# Patient Record
Sex: Female | Born: 1970 | Race: White | Hispanic: No | Marital: Married | Smoking: Current every day smoker
Health system: Southern US, Community
[De-identification: ages and names within clinical notes are randomized; demographics above are authoritative.]

## PROBLEM LIST (undated history)

## (undated) DIAGNOSIS — R51 Headache: Secondary | ICD-10-CM

## (undated) DIAGNOSIS — R519 Headache, unspecified: Secondary | ICD-10-CM

## (undated) DIAGNOSIS — D649 Anemia, unspecified: Secondary | ICD-10-CM

## (undated) DIAGNOSIS — F32A Depression, unspecified: Secondary | ICD-10-CM

## (undated) DIAGNOSIS — Z789 Other specified health status: Secondary | ICD-10-CM

## (undated) DIAGNOSIS — F329 Major depressive disorder, single episode, unspecified: Secondary | ICD-10-CM

## (undated) HISTORY — PX: OOPHORECTOMY: SHX86

## (undated) HISTORY — DX: Other specified health status: Z78.9

## (undated) HISTORY — PX: COLONOSCOPY: SHX5424

## (undated) HISTORY — PX: OTHER SURGICAL HISTORY: SHX169

## (undated) HISTORY — PX: CHOLECYSTECTOMY: SHX55

## (undated) HISTORY — PX: LAPAROSCOPY: SHX197

## (undated) HISTORY — PX: ABDOMINAL HYSTERECTOMY: SHX81

---

## 1998-04-23 ENCOUNTER — Ambulatory Visit (HOSPITAL_COMMUNITY): Admission: RE | Admit: 1998-04-23 | Discharge: 1998-04-24 | Payer: Self-pay

## 1998-12-04 ENCOUNTER — Ambulatory Visit (HOSPITAL_COMMUNITY): Admission: RE | Admit: 1998-12-04 | Discharge: 1998-12-04 | Payer: Self-pay | Admitting: *Deleted

## 1999-01-09 ENCOUNTER — Inpatient Hospital Stay (HOSPITAL_COMMUNITY): Admission: RE | Admit: 1999-01-09 | Discharge: 1999-01-11 | Payer: Self-pay | Admitting: Obstetrics and Gynecology

## 1999-07-23 ENCOUNTER — Emergency Department (HOSPITAL_COMMUNITY): Admission: EM | Admit: 1999-07-23 | Discharge: 1999-07-23 | Payer: Self-pay | Admitting: Emergency Medicine

## 1999-07-23 ENCOUNTER — Encounter: Payer: Self-pay | Admitting: Emergency Medicine

## 1999-09-10 ENCOUNTER — Ambulatory Visit: Admission: RE | Admit: 1999-09-10 | Discharge: 1999-09-10 | Payer: Self-pay | Admitting: Occupational Medicine

## 1999-09-10 ENCOUNTER — Encounter: Payer: Self-pay | Admitting: Occupational Medicine

## 2000-02-02 ENCOUNTER — Emergency Department (HOSPITAL_COMMUNITY): Admission: EM | Admit: 2000-02-02 | Discharge: 2000-02-02 | Payer: Self-pay | Admitting: Emergency Medicine

## 2000-02-02 ENCOUNTER — Encounter: Payer: Self-pay | Admitting: Emergency Medicine

## 2000-08-25 ENCOUNTER — Encounter: Payer: Self-pay | Admitting: Pulmonary Disease

## 2000-08-25 ENCOUNTER — Encounter: Admission: RE | Admit: 2000-08-25 | Discharge: 2000-08-25 | Payer: Self-pay | Admitting: Pulmonary Disease

## 2000-12-20 ENCOUNTER — Ambulatory Visit (HOSPITAL_COMMUNITY): Admission: RE | Admit: 2000-12-20 | Discharge: 2000-12-20 | Payer: Self-pay | Admitting: Occupational Medicine

## 2000-12-20 ENCOUNTER — Encounter: Payer: Self-pay | Admitting: Occupational Medicine

## 2001-03-15 ENCOUNTER — Emergency Department (HOSPITAL_COMMUNITY): Admission: EM | Admit: 2001-03-15 | Discharge: 2001-03-15 | Payer: Self-pay | Admitting: Emergency Medicine

## 2001-08-29 ENCOUNTER — Ambulatory Visit (HOSPITAL_COMMUNITY): Admission: RE | Admit: 2001-08-29 | Discharge: 2001-08-29 | Payer: Self-pay | Admitting: Pulmonary Disease

## 2001-08-29 ENCOUNTER — Encounter: Payer: Self-pay | Admitting: Pulmonary Disease

## 2001-11-30 ENCOUNTER — Emergency Department (HOSPITAL_COMMUNITY): Admission: EM | Admit: 2001-11-30 | Discharge: 2001-11-30 | Payer: Self-pay | Admitting: Internal Medicine

## 2003-08-16 ENCOUNTER — Encounter: Payer: Self-pay | Admitting: *Deleted

## 2003-08-16 ENCOUNTER — Emergency Department (HOSPITAL_COMMUNITY): Admission: EM | Admit: 2003-08-16 | Discharge: 2003-08-16 | Payer: Self-pay | Admitting: *Deleted

## 2006-08-08 ENCOUNTER — Encounter: Admission: RE | Admit: 2006-08-08 | Discharge: 2006-08-08 | Payer: Self-pay | Admitting: Obstetrics and Gynecology

## 2010-04-30 ENCOUNTER — Ambulatory Visit: Payer: Self-pay | Admitting: Gastroenterology

## 2016-12-03 ENCOUNTER — Other Ambulatory Visit: Payer: Self-pay

## 2016-12-03 ENCOUNTER — Encounter: Payer: Self-pay | Admitting: Gastroenterology

## 2016-12-03 ENCOUNTER — Ambulatory Visit (INDEPENDENT_AMBULATORY_CARE_PROVIDER_SITE_OTHER): Payer: Self-pay | Admitting: Gastroenterology

## 2016-12-03 DIAGNOSIS — R1310 Dysphagia, unspecified: Secondary | ICD-10-CM

## 2016-12-03 DIAGNOSIS — R1319 Other dysphagia: Secondary | ICD-10-CM

## 2016-12-03 MED ORDER — PANTOPRAZOLE SODIUM 40 MG PO TBEC: 40.0000 mg | DELAYED_RELEASE_TABLET | Freq: Every day | ORAL | 3 refills | Status: DC

## 2016-12-03 NOTE — Patient Instructions (Signed)
Start taking Protonix once each morning, 30 minutes before breakfast.   We have scheduled you for an upper endoscopy with dilation with Dr. Jena Gaussourk in the near future.

## 2016-12-03 NOTE — Progress Notes (Signed)
  Primary Care Physician:  GOLDING, JOHN CABOT, MD Primary Gastroenterologist:  Dr. Rourk   Chief Complaint  Patient presents with  . Dysphagia    x 2 yrs, getting worse. Hurts in esophagus    HPI:   Nancy Strickland is a 46 y.o. female presenting today at the request of her PCP secondary to dysphagia. She notes chronic symptoms for 2 years but worsening recently over past 2-3 months. Being treated for URI currently. Not better yet and has been on antibiotics for 8 days. Notes globus sensation as well. Rare indigestion but really just depends on what she eats. Tries to avoid foods that trigger it. Not on a reflux medication. If lays flat, feels like air is being cut off. Sometimes odynophagia. Taking Aleve once a day.   Notes significant weight gain in the past year. Has gained about 30 lbs in last 4 months. Feels like she may have gained about 40-50 lbs over past year.   Had an EGD by Dr. Skulskie at [facility] in 2011 noting an irregular Z-line, path noting focal intestinal metaplasia and concern for Barrett's, chronic active gastritis negative H.pylori, normal examined duodenum.  Remote colonoscopy, small hyperplastic polyps per patient report. Requesting records.   Past Medical History:  Diagnosis Date  . Medical history non-contributory     Past Surgical History:  Procedure Laterality Date  . ABDOMINAL HYSTERECTOMY    . CHOLECYSTECTOMY    . ESOPHAGOGASTRODUODENOSCOPY  2011   Dr. Skulskie at [facility]: irregular Z-line, path noting focal intestinal metaplasia and questioning Barrett's, chronic active gastritis negative H.pylori, normal examined duodenum.   . LAPAROSCOPY     several   . OOPHORECTOMY    . right breast lumpectomy     benign    Current Outpatient Prescriptions  Medication Sig Dispense Refill  . amoxicillin-clavulanate (AUGMENTIN) 875-125 MG tablet Take 1 tablet by mouth 2 (two) times daily. Started 11/24/16. For total of 10 days. Taking for Upper respiratory  infection.    . naproxen sodium (ANAPROX) 220 MG tablet Take 220 mg by mouth as needed.    . OVER THE COUNTER MEDICATION Mucinex liquid (pt unsure of exact name of med)    . pantoprazole (PROTONIX) 40 MG tablet Take 1 tablet (40 mg total) by mouth daily. Take 30 minutes prior to breakfast 30 tablet 3   No current facility-administered medications for this visit.     Allergies as of 12/03/2016 - Review Complete 12/03/2016  Allergen Reaction Noted  . Codeine Nausea And Vomiting 12/03/2016  . Hydrocodone Nausea And Vomiting 12/03/2016  . Latex Other (See Comments) 12/03/2016  . Oxycodone Nausea And Vomiting 12/03/2016  . Tramadol  12/03/2016  . Vicodin [hydrocodone-acetaminophen] Nausea And Vomiting 12/03/2016    Family History  Problem Relation Age of Onset  . Colon cancer Neg Hx     Social History   Social History  . Marital status: Single    Spouse name: N/A  . Number of children: N/A  . Years of education: N/A   Occupational History  . Not on file.   Social History Main Topics  . Smoking status: Current Every Day Smoker    Packs/day: 1.00    Years: 23.00  . Smokeless tobacco: Never Used  . Alcohol use No  . Drug use: No  . Sexual activity: Not on file   Other Topics Concern  . Not on file   Social History Narrative  . No narrative on file    Review of Systems:   As mentioned in HPI.   Physical Exam: BP 130/86   Pulse 83   Temp 98 F (36.7 C) (Oral)   Ht 5' 3" (1.6 m)   Wt 297 lb 9.6 oz (135 kg)   BMI 52.72 kg/m  General:   Alert and oriented. Pleasant and cooperative. Well-nourished and well-developed.  Head:  Normocephalic and atraumatic. Eyes:  Without icterus, sclera clear and conjunctiva pink.  Ears:  Normal auditory acuity. Nose:  No deformity, discharge,  or lesions. Mouth:  No deformity or lesions, oral mucosa pink.  Lungs:  Scattered rhonchi  Heart:  S1, S2 present without murmurs appreciated.  Abdomen:  +BS, soft, non-tender and  non-distended. No HSM noted. No guarding or rebound. No masses appreciated.  Rectal:  Deferred  Msk:  Symmetrical without gross deformities. Normal posture. Extremities:  Without  edema. Neurologic:  Alert and  oriented x4;  grossly normal neurologically. Psych:  Alert and cooperative. Normal mood and affect.   

## 2016-12-12 ENCOUNTER — Encounter: Payer: Self-pay | Admitting: Gastroenterology

## 2016-12-12 NOTE — Assessment & Plan Note (Addendum)
46 year old female with chronic dysphagia for the past 2 years but worsening over the past several months, associated with odynophagia, in the setting of GERD. Globus sensation present as well. Last EGD at [facility] in 2011 by Dr. Marva PandaSkulskie as noted in HPI. Currently not on a PPI. Will start Protonix once each morning and proceed with EGD/dilation in near future. Follow soft diet for now.   Proceed with upper endoscopy/dilation in the near future with Dr. Jena Gaussourk. The risks, benefits, and alternatives have been discussed in detail with patient. They have stated understanding and desire to proceed.  PROPOFOL (last procedure in 2011 with Propofol)

## 2016-12-13 NOTE — Progress Notes (Signed)
cc'ed to pcp °

## 2016-12-20 NOTE — Patient Instructions (Signed)
Nancy Strickland  12/20/2016     @PREFPERIOPPHARMACY @   Your procedure is scheduled on  12/27/2016   Report to [facility] at  815  A.M.  Call this number if you have problems the morning of surgery:  [phone]   Remember:  Do not eat food or drink liquids after midnight.  Take these medicines the morning of surgery with A SIP OF WATER  protonix.   Do not wear jewelry, make-up or nail polish.  Do not wear lotions, powders, or perfumes, or deoderant.  Do not shave 48 hours prior to surgery.  Men may shave face and neck.  Do not bring valuables to the hospital.  [facility] is not responsible for any belongings or valuables.  Contacts, dentures or bridgework may not be worn into surgery.  Leave your suitcase in the car.  After surgery it may be brought to your room.  For patients admitted to the hospital, discharge time will be determined by your treatment team.  Patients discharged the day of surgery will not be allowed to drive home.   Name and phone number of your driver:   family Special instructions:  Follow the diet and prep instructions given to you by Dr Luvenia Starch office.  Please read over the following fact sheets that you were given. Anesthesia Post-op Instructions and Care and Recovery After Surgery       Esophagogastroduodenoscopy Introduction Esophagogastroduodenoscopy (EGD) is a procedure to examine the lining of the esophagus, stomach, and first part of the small intestine (duodenum). This procedure is done to check for problems such as inflammation, bleeding, ulcers, or growths. During this procedure, a long, flexible, lighted tube with a camera attached (endoscope) is inserted down the throat. Tell a health care provider about:  Any allergies you have.  All medicines you are taking, including vitamins, herbs, eye drops, creams, and over-the-counter medicines.  Any problems you or family members have had with anesthetic  medicines.  Any blood disorders you have.  Any surgeries you have had.  Any medical conditions you have.  Whether you are pregnant or may be pregnant. What are the risks? Generally, this is a safe procedure. However, problems may occur, including:  Infection.  Bleeding.  A tear (perforation) in the esophagus, stomach, or duodenum.  Trouble breathing.  Excessive sweating.  Spasms of the larynx.  A slowed heartbeat.  Low blood pressure. What happens before the procedure?  Follow instructions from your health care provider about eating or drinking restrictions.  Ask your health care provider about:  Changing or stopping your regular medicines. This is especially important if you are taking diabetes medicines or blood thinners.  Taking medicines such as aspirin and ibuprofen. These medicines can thin your blood. Do not take these medicines before your procedure if your health care provider instructs you not to.  Plan to have someone take you home after the procedure.  If you wear dentures, be ready to remove them before the procedure. What happens during the procedure?  To reduce your risk of infection, your health care team will wash or sanitize their hands.  An IV tube will be put in a vein in your hand or arm. You will get medicines and fluids through this tube.  You will be given one or more of the following:  A medicine to help you relax (sedative).  A medicine to numb the area (local anesthetic). This medicine may be sprayed into  your throat. It will make you feel more comfortable and keep you from gagging or coughing during the procedure.  A medicine for pain.  A mouth guard may be placed in your mouth to protect your teeth and to keep you from biting on the endoscope.  You will be asked to lie on your left side.  The endoscope will be lowered down your throat into your esophagus, stomach, and duodenum.  Air will be put into the endoscope. This will help  your health care provider see better.  The lining of your esophagus, stomach, and duodenum will be examined.  Your health care provider may:  Take a tissue sample so it can be looked at in a lab (biopsy).  Remove growths.  Remove objects (foreign bodies) that are stuck.  Treat any bleeding with medicines or other devices that stop tissue from bleeding.  Widen (dilate) or stretch narrowed areas of your esophagus and stomach.  The endoscope will be taken out. The procedure may vary among health care providers and hospitals. What happens after the procedure?  Your blood pressure, heart rate, breathing rate, and blood oxygen level will be monitored often until the medicines you were given have worn off.  Do not eat or drink anything until the numbing medicine has worn off and your gag reflex has returned. This information is not intended to replace advice given to you by your health care provider. Make sure you discuss any questions you have with your health care provider. Document Released: 03/18/2005 Document Revised: 04/22/2016 Document Reviewed: 10/09/2015  2017 Elsevier Esophagogastroduodenoscopy, Care After Introduction Refer to this sheet in the next few weeks. These instructions provide you with information about caring for yourself after your procedure. Your health care provider may also give you more specific instructions. Your treatment has been planned according to current medical practices, but problems sometimes occur. Call your health care provider if you have any problems or questions after your procedure. What can I expect after the procedure? After the procedure, it is common to have:  A sore throat.  Nausea.  Bloating.  Dizziness.  Fatigue. Follow these instructions at home:  Do not eat or drink anything until the numbing medicine (local anesthetic) has worn off and your gag reflex has returned. You will know that the local anesthetic has worn off when you  can swallow comfortably.  Do not drive for 24 hours if you received a medicine to help you relax (sedative).  If your health care provider took a tissue sample for testing during the procedure, make sure to get your test results. This is your responsibility. Ask your health care provider or the department performing the test when your results will be ready.  Keep all follow-up visits as told by your health care provider. This is important. Contact a health care provider if:  You cannot stop coughing.  You are not urinating.  You are urinating less than usual. Get help right away if:  You have trouble swallowing.  You cannot eat or drink.  You have throat or chest pain that gets worse.  You are dizzy or light-headed.  You faint.  You have nausea or vomiting.  You have chills.  You have a fever.  You have severe abdominal pain.  You have black, tarry, or bloody stools. This information is not intended to replace advice given to you by your health care provider. Make sure you discuss any questions you have with your health care provider. Document Released: 11/01/2012 Document  Revised: 04/22/2016 Document Reviewed: 10/09/2015  2017 Elsevier  Monitored Anesthesia Care Anesthesia is a term that refers to techniques, procedures, and medicines that help a person stay safe and comfortable during a medical procedure. Monitored anesthesia care, or sedation, is one type of anesthesia. Your anesthesia specialist may recommend sedation if you will be having a procedure that does not require you to be unconscious, such as:  Cataract surgery.  A dental procedure.  A biopsy.  A colonoscopy. During the procedure, you may receive a medicine to help you relax (sedative). There are three levels of sedation:  Mild sedation. At this level, you may feel awake and relaxed. You will be able to follow directions.  Moderate sedation. At this level, you will be sleepy. You may not remember  the procedure.  Deep sedation. At this level, you will be asleep. You will not remember the procedure. The more medicine you are given, the deeper your level of sedation will be. Depending on how you respond to the procedure, the anesthesia specialist may change your level of sedation or the type of anesthesia to fit your needs. An anesthesia specialist will monitor you closely during the procedure. Let your health care provider know about:  Any allergies you have.  All medicines you are taking, including vitamins, herbs, eye drops, creams, and over-the-counter medicines.  Any use of steroids (by mouth or as a cream).  Any problems you or family members have had with sedatives and anesthetic medicines.  Any blood disorders you have.  Any surgeries you have had.  Any medical conditions you have, such as sleep apnea.  Whether you are pregnant or may be pregnant.  Any use of cigarettes, alcohol, or street drugs. What are the risks? Generally, this is a safe procedure. However, problems may occur, including:  Getting too much medicine (oversedation).  Nausea.  Allergic reaction to medicines.  Trouble breathing. If this happens, a breathing tube may be used to help with breathing. It will be removed when you are awake and breathing on your own.  Heart trouble.  Lung trouble. Before the procedure Staying hydrated  Follow instructions from your health care provider about hydration, which may include:  Up to 2 hours before the procedure - you may continue to drink clear liquids, such as water, clear fruit juice, black coffee, and plain tea. Eating and drinking restrictions  Follow instructions from your health care provider about eating and drinking, which may include:  8 hours before the procedure - stop eating heavy meals or foods such as meat, fried foods, or fatty foods.  6 hours before the procedure - stop eating light meals or foods, such as toast or cereal.  6 hours  before the procedure - stop drinking milk or drinks that contain milk.  2 hours before the procedure - stop drinking clear liquids. Medicines  Ask your health care provider about:  Changing or stopping your regular medicines. This is especially important if you are taking diabetes medicines or blood thinners.  Taking medicines such as aspirin and ibuprofen. These medicines can thin your blood. Do not take these medicines before your procedure if your health care provider instructs you not to. Tests and exams  You will have a physical exam.  You may have blood tests done to show:  How well your kidneys and liver are working.  How well your blood can clot.  General instructions  Plan to have someone take you home from the hospital or clinic.  If you  will be going home right after the procedure, plan to have someone with you for 24 hours. What happens during the procedure?  Your blood pressure, heart rate, breathing, level of pain and overall condition will be monitored.  An IV tube will be inserted into one of your veins.  Your anesthesia specialist will give you medicines as needed to keep you comfortable during the procedure. This may mean changing the level of sedation.  The procedure will be performed. After the procedure  Your blood pressure, heart rate, breathing rate, and blood oxygen level will be monitored until the medicines you were given have worn off.  Do not drive for 24 hours if you received a sedative.  You may:  Feel sleepy, clumsy, or nauseous.  Feel forgetful about what happened after the procedure.  Have a sore throat if you had a breathing tube during the procedure.  Vomit. This information is not intended to replace advice given to you by your health care provider. Make sure you discuss any questions you have with your health care provider. Document Released: 08/11/2005 Document Revised: 04/23/2016 Document Reviewed: 03/07/2016 Elsevier Interactive  Patient Education  2017 Elsevier Inc. Monitored Anesthesia Care, Care After These instructions provide you with information about caring for yourself after your procedure. Your health care provider may also give you more specific instructions. Your treatment has been planned according to current medical practices, but problems sometimes occur. Call your health care provider if you have any problems or questions after your procedure. What can I expect after the procedure? After your procedure, it is common to:  Feel sleepy for several hours.  Feel clumsy and have poor balance for several hours.  Feel forgetful about what happened after the procedure.  Have poor judgment for several hours.  Feel nauseous or vomit.  Have a sore throat if you had a breathing tube during the procedure. Follow these instructions at home: For at least 24 hours after the procedure:   Do not:  Participate in activities in which you could fall or become injured.  Drive.  Use heavy machinery.  Drink alcohol.  Take sleeping pills or medicines that cause drowsiness.  Make important decisions or sign legal documents.  Take care of children on your own.  Rest. Eating and drinking  Follow the diet that is recommended by your health care provider.  If you vomit, drink water, juice, or soup when you can drink without vomiting.  Make sure you have little or no nausea before eating solid foods. General instructions  Have a responsible adult stay with you until you are awake and alert.  Take over-the-counter and prescription medicines only as told by your health care provider.  If you smoke, do not smoke without supervision.  Keep all follow-up visits as told by your health care provider. This is important. Contact a health care provider if:  You keep feeling nauseous or you keep vomiting.  You feel light-headed.  You develop a rash.  You have a fever. Get help right away if:  You have  trouble breathing. This information is not intended to replace advice given to you by your health care provider. Make sure you discuss any questions you have with your health care provider. Document Released: 03/07/2016 Document Revised: 07/07/2016 Document Reviewed: 03/07/2016 Elsevier Interactive Patient Education  2017 [employer]

## 2016-12-22 ENCOUNTER — Encounter (HOSPITAL_COMMUNITY): Payer: Self-pay | Admitting: *Deleted

## 2016-12-22 ENCOUNTER — Encounter (HOSPITAL_COMMUNITY)
Admission: RE | Admit: 2016-12-22 | Discharge: 2016-12-22 | Disposition: A | Discharge status: Home / Self Care | Payer: Self-pay | Source: Ambulatory Visit | Attending: Internal Medicine | Admitting: Internal Medicine

## 2016-12-22 DIAGNOSIS — R1310 Dysphagia, unspecified: Secondary | ICD-10-CM | POA: Insufficient documentation

## 2016-12-22 DIAGNOSIS — K219 Gastro-esophageal reflux disease without esophagitis: Secondary | ICD-10-CM | POA: Insufficient documentation

## 2016-12-22 DIAGNOSIS — Z01812 Encounter for preprocedural laboratory examination: Secondary | ICD-10-CM | POA: Insufficient documentation

## 2016-12-22 HISTORY — DX: Headache: R51

## 2016-12-22 HISTORY — DX: Depression, unspecified: F32.A

## 2016-12-22 HISTORY — DX: Headache, unspecified: R51.9

## 2016-12-22 HISTORY — DX: Anemia, unspecified: D64.9

## 2016-12-22 HISTORY — DX: Major depressive disorder, single episode, unspecified: F32.9

## 2016-12-22 LAB — CBC WITH DIFFERENTIAL/PLATELET
BASOS ABS: 0 10*3/uL (ref 0.0–0.1)
Basophils Relative: 0 %
Eosinophils Absolute: 0.2 10*3/uL (ref 0.0–0.7)
Eosinophils Relative: 2 %
HEMATOCRIT: 47.3 % — AB (ref 36.0–46.0)
Hemoglobin: 16.3 g/dL — ABNORMAL HIGH (ref 12.0–15.0)
LYMPHS ABS: 3.1 10*3/uL (ref 0.7–4.0)
LYMPHS PCT: 36 %
MCH: 31.9 pg (ref 26.0–34.0)
MCHC: 34.5 g/dL (ref 30.0–36.0)
MCV: 92.6 fL (ref 78.0–100.0)
MONO ABS: 0.5 10*3/uL (ref 0.1–1.0)
MONOS PCT: 6 %
NEUTROS ABS: 4.8 10*3/uL (ref 1.7–7.7)
Neutrophils Relative %: 56 %
Platelets: 219 10*3/uL (ref 150–400)
RBC: 5.11 MIL/uL (ref 3.87–5.11)
RDW: 13.8 % (ref 11.5–15.5)
WBC: 8.6 10*3/uL (ref 4.0–10.5)

## 2016-12-22 LAB — BASIC METABOLIC PANEL
ANION GAP: 7 (ref 5–15)
BUN: 18 mg/dL (ref 6–20)
CALCIUM: 9.3 mg/dL (ref 8.9–10.3)
CO2: 28 mmol/L (ref 22–32)
Chloride: 102 mmol/L (ref 101–111)
Creatinine, Ser: 0.75 mg/dL (ref 0.44–1.00)
GFR calc Af Amer: >60 mL/min (ref >60)
GFR calc non Af Amer: >60 mL/min (ref >60)
GLUCOSE: 108 mg/dL — AB (ref 65–99)
POTASSIUM: 4.3 mmol/L (ref 3.5–5.1)
Sodium: 137 mmol/L (ref 135–145)

## 2016-12-23 ENCOUNTER — Encounter: Payer: Self-pay | Admitting: Internal Medicine

## 2016-12-27 ENCOUNTER — Other Ambulatory Visit: Payer: Self-pay

## 2016-12-27 ENCOUNTER — Encounter (HOSPITAL_COMMUNITY): Payer: Self-pay | Admitting: *Deleted

## 2016-12-27 ENCOUNTER — Encounter (HOSPITAL_COMMUNITY)
Admission: RE | Disposition: A | Discharge status: Home / Self Care | Payer: Self-pay | Source: Ambulatory Visit | Attending: Internal Medicine

## 2016-12-27 ENCOUNTER — Ambulatory Visit (HOSPITAL_COMMUNITY): Payer: Self-pay | Admitting: Anesthesiology

## 2016-12-27 ENCOUNTER — Ambulatory Visit (HOSPITAL_COMMUNITY)
Admission: RE | Admit: 2016-12-27 | Discharge: 2016-12-27 | Disposition: A | Discharge status: Home / Self Care | Payer: Self-pay | Source: Ambulatory Visit | Attending: Internal Medicine | Admitting: Internal Medicine

## 2016-12-27 DIAGNOSIS — Z6841 Body Mass Index (BMI) 40.0 and over, adult: Secondary | ICD-10-CM | POA: Insufficient documentation

## 2016-12-27 DIAGNOSIS — H9209 Otalgia, unspecified ear: Secondary | ICD-10-CM

## 2016-12-27 DIAGNOSIS — K3189 Other diseases of stomach and duodenum: Secondary | ICD-10-CM

## 2016-12-27 DIAGNOSIS — K259 Gastric ulcer, unspecified as acute or chronic, without hemorrhage or perforation: Secondary | ICD-10-CM | POA: Insufficient documentation

## 2016-12-27 DIAGNOSIS — F17210 Nicotine dependence, cigarettes, uncomplicated: Secondary | ICD-10-CM | POA: Insufficient documentation

## 2016-12-27 DIAGNOSIS — Z79899 Other long term (current) drug therapy: Secondary | ICD-10-CM | POA: Insufficient documentation

## 2016-12-27 DIAGNOSIS — K449 Diaphragmatic hernia without obstruction or gangrene: Secondary | ICD-10-CM | POA: Insufficient documentation

## 2016-12-27 DIAGNOSIS — B9681 Helicobacter pylori [H. pylori] as the cause of diseases classified elsewhere: Secondary | ICD-10-CM | POA: Insufficient documentation

## 2016-12-27 DIAGNOSIS — K228 Other specified diseases of esophagus: Secondary | ICD-10-CM | POA: Insufficient documentation

## 2016-12-27 DIAGNOSIS — R1310 Dysphagia, unspecified: Secondary | ICD-10-CM | POA: Insufficient documentation

## 2016-12-27 DIAGNOSIS — K2950 Unspecified chronic gastritis without bleeding: Secondary | ICD-10-CM | POA: Insufficient documentation

## 2016-12-27 DIAGNOSIS — M542 Cervicalgia: Secondary | ICD-10-CM

## 2016-12-27 HISTORY — PX: BIOPSY: SHX5522

## 2016-12-27 HISTORY — PX: ESOPHAGOGASTRODUODENOSCOPY (EGD) WITH PROPOFOL: SHX5813

## 2016-12-27 HISTORY — PX: MALONEY DILATION: SHX5535

## 2016-12-27 SURGERY — ESOPHAGOGASTRODUODENOSCOPY (EGD) WITH PROPOFOL
Anesthesia: Monitor Anesthesia Care

## 2016-12-27 MED ORDER — CHLORHEXIDINE GLUCONATE CLOTH 2 % EX PADS: 6.0000 | MEDICATED_PAD | Freq: Once | CUTANEOUS | Status: DC

## 2016-12-27 MED ORDER — PROPOFOL 10 MG/ML IV BOLUS
INTRAVENOUS | Status: DC | PRN
  Administered 2016-12-27: 10 mg via INTRAVENOUS

## 2016-12-27 MED ORDER — FENTANYL CITRATE (PF) 100 MCG/2ML IJ SOLN
25.0000 ug | INTRAMUSCULAR | Status: AC | PRN
  Administered 2016-12-27 (×2): 25 ug via INTRAVENOUS

## 2016-12-27 MED ORDER — MIDAZOLAM HCL 2 MG/2ML IJ SOLN
0.5000 mg | INTRAMUSCULAR | Status: DC | PRN
  Administered 2016-12-27: 2 mg via INTRAVENOUS
  Filled 2016-12-27: qty 2

## 2016-12-27 MED ORDER — PROPOFOL 500 MG/50ML IV EMUL
INTRAVENOUS | Status: DC | PRN
  Administered 2016-12-27: 125 ug/kg/min via INTRAVENOUS

## 2016-12-27 MED ORDER — LIDOCAINE VISCOUS 2 % MT SOLN
OROMUCOSAL | Status: AC
  Filled 2016-12-27: qty 15

## 2016-12-27 MED ORDER — PROPOFOL 10 MG/ML IV BOLUS
INTRAVENOUS | Status: AC
Start: 2016-12-27 — End: 2016-12-27
  Filled 2016-12-27: qty 40

## 2016-12-27 MED ORDER — ONDANSETRON HCL 4 MG/2ML IJ SOLN
4.0000 mg | Freq: Once | INTRAMUSCULAR | Status: AC
  Administered 2016-12-27: 4 mg via INTRAVENOUS

## 2016-12-27 MED ORDER — LACTATED RINGERS IV SOLN
INTRAVENOUS | Status: DC
  Administered 2016-12-27: 1000 mL via INTRAVENOUS

## 2016-12-27 MED ORDER — FENTANYL CITRATE (PF) 100 MCG/2ML IJ SOLN
INTRAMUSCULAR | Status: AC
  Filled 2016-12-27: qty 2

## 2016-12-27 MED ORDER — LIDOCAINE VISCOUS 2 % MT SOLN
3.0000 mL | OROMUCOSAL | Status: AC | PRN
  Administered 2016-12-27 (×2): 3 mL via OROMUCOSAL

## 2016-12-27 MED ORDER — ONDANSETRON HCL 4 MG/2ML IJ SOLN
INTRAMUSCULAR | Status: AC
  Filled 2016-12-27: qty 2

## 2016-12-27 NOTE — Interval H&P Note (Signed)
History and Physical Interval Note:  12/27/2016 10:04 AM  Nancy Strickland  has presented today for surgery, with the diagnosis of dysphagia  The various methods of treatment have been discussed with the patient and family. After consideration of risks, benefits and other options for treatment, the patient has consented to  Procedure(s) with comments: ESOPHAGOGASTRODUODENOSCOPY (EGD) WITH PROPOFOL (N/A) - 945 MALONEY DILATION (N/A) as a surgical intervention .  The patient's history has been reviewed, patient examined, no change in status, stable for surgery.  I have reviewed the patient's chart and labs.  Questions were answered to the patient's satisfaction.     Nancy Strickland  No change. EGD/ED as feasible appropriate per plan.  The risks, benefits, limitations, alternatives and imponderables have been reviewed with the patient. Potential for esophageal dilation, biopsy, etc. have also been reviewed.  Questions have been answered. All parties agreeable.

## 2016-12-27 NOTE — Anesthesia Postprocedure Evaluation (Signed)
Anesthesia Post Note  Patient: Nancy Strickland  Procedure(s) Performed: Procedure(s) (LRB): ESOPHAGOGASTRODUODENOSCOPY (EGD) WITH PROPOFOL (N/A) MALONEY DILATION (N/A) BIOPSY  Patient location during evaluation: PACU Anesthesia Type: MAC Level of consciousness: awake and alert and oriented Pain management: pain level controlled Respiratory status: spontaneous breathing Cardiovascular status: blood pressure returned to baseline Postop Assessment: no signs of nausea or vomiting Anesthetic complications: no     Last Vitals:  Vitals:   12/27/16 1050 12/27/16 1100  BP: (!) 91/56 (!) 106/54  Pulse: 92 84  Resp: 13 17  Temp:      Last Pain:  Vitals:   12/27/16 1048  TempSrc:   PainSc: 0-No pain                 Yusuke Beza

## 2016-12-27 NOTE — Anesthesia Preprocedure Evaluation (Signed)
Anesthesia Evaluation  Patient identified by MRN, date of birth, ID band Patient awake    Reviewed: Allergy & Precautions, NPO status , Patient's Chart, lab work & pertinent test results  Airway Mallampati: II  TM Distance: >3 FB     Dental  (+) Teeth Intact, Implants   Pulmonary neg pulmonary ROS, Current Smoker,    breath sounds clear to auscultation       Cardiovascular negative cardio ROS   Rhythm:Regular Rate:Normal     Neuro/Psych  Headaches, PSYCHIATRIC DISORDERS Depression    GI/Hepatic Dysphagia    Endo/Other  Morbid obesity  Renal/GU      Musculoskeletal   Abdominal   Peds  Hematology  (+) anemia ,   Anesthesia Other Findings   Reproductive/Obstetrics                             Anesthesia Physical Anesthesia Plan  ASA: II  Anesthesia Plan: MAC   Post-op Pain Management:    Induction: Intravenous  Airway Management Planned: Simple Face Mask  Additional Equipment:   Intra-op Plan:   Post-operative Plan:   Informed Consent: I have reviewed the patients History and Physical, chart, labs and discussed the procedure including the risks, benefits and alternatives for the proposed anesthesia with the patient or authorized representative who has indicated his/her understanding and acceptance.     Plan Discussed with:   Anesthesia Plan Comments:         Anesthesia Quick Evaluation

## 2016-12-27 NOTE — Op Note (Signed)
[facility] Patient Name: Nancy Strickland Procedure Date: 12/27/2016 8:54 AM MRN: [id number] Date of Birth: [birth year] Attending MD: Gennette Pacobert Michael Jaszmine Navejas , MD CSN: [id number] Age: 89 Admit Type: Outpatient Procedure:                [facility]                            gastric biopsy Indications:              Dysphagia Providers:                Gennette Pacobert Michael Carver Murakami, MD, Nena PolioLisa Moore, RN, Lollie Marrowaylor B.                            [place], Pensions consultantTechnician Referring MD:              Medicines:                Propofol per Anesthesia Complications:            No immediate complications. Estimated Blood Loss:     Estimated blood loss was minimal. Procedure:                Pre-Anesthesia Assessment:                           - Prior to the procedure, a History and Physical                            was performed, and patient medications and                            allergies were reviewed. The patient's tolerance of                            previous anesthesia was also reviewed. The risks                            and benefits of the procedure and the sedation                            options and risks were discussed with the patient.                            All questions were answered, and informed consent                            was obtained. Prior Anticoagulants: The patient has                            taken no previous anticoagulant or antiplatelet                            agents. ASA Grade Assessment: II - A patient with  mild systemic disease. After reviewing the risks                            and benefits, the patient was deemed in                            satisfactory condition to undergo the procedure.                           After obtaining informed consent, the endoscope was                            passed under direct vision. Throughout the                            procedure, the patient's blood  pressure, pulse, and                            oxygen saturations were monitored continuously. The                            EG-[id number] ([id number]) scope was introduced through the                            mouth, and advanced to the second part of duodenum.                            The upper GI endoscopy was accomplished without                            difficulty. The patient tolerated the procedure                            well. Scope In: 10:30:38 AM Scope Out: 10:39:36 AM Total Procedure Duration: 0 hours 8 minutes 58 seconds  Findings:      Abnormal distal esophagus consistent with short segment Barrett's. No       nodularity. No esophagitis.      A small hiatal hernia was present. Multiple gastric erosion. No ulcer or       infiltrating process observed. Patent pylorus. duodenal bulbar and D2       erosions; .Dilation was performed with a Maloney dilator with mild       resistance at 54 Fr. The dilation site was examined following endoscope       reinsertion and showed no change. Estimated blood loss: none biopsies of       the anormal gastric mucosa and distal esophagus taken for histologic       study Impression:               - Mucosal changes in the esophagus suspicious for                            Barrett's epithelium. Status post esophageal  dilation followed by biopsy                           - Erosive gastropathy. hiatal hernia. Gastric                            erosions?"status post biopsy.                           - No specimens collected. Moderate Sedation:      Moderate (conscious) sedation was personally administered by an       anesthesia professional. The following parameters were monitored: oxygen       saturation, heart rate, blood pressure, respiratory rate, EKG, adequacy       of pulmonary ventilation, and response to care. Total physician       intraservice time was 12 minutes. Recommendation:           - Patient has a  contact number available for                            emergencies. The signs and symptoms of potential                            delayed complications were discussed with the                            patient. Return to normal activities tomorrow.                            Written discharge instructions were provided to the                            patient.                           - Resume previous diet.                           - Continue present medications.                           - Await pathology results.                           - Procedure Code(s):        --- Professional ---                           [phone], Esophagogastroduodenoscopy, flexible,                            transoral; diagnostic, including collection of                            specimen(s) by brushing or washing, when performed                            (separate  procedure) Diagnosis Code(s):        --- Professional ---                           K22.8, Other specified diseases of esophagus                           K31.89, Other diseases of stomach and duodenum                           R13.10, Dysphagia, unspecified CPT copyright 2016 American Medical Association. All rights reserved. The codes documented in this report are preliminary and upon coder review may  be revised to meet current compliance requirements. Gerrit Friends. Elienai Gailey, MD Gennette Pac, MD 12/27/2016 11:29:04 AM This report has been signed electronically. Number of Addenda: 0

## 2016-12-27 NOTE — Discharge Instructions (Signed)
Gastroesophageal Reflux Disease, Adult Introduction Normally, food travels down the esophagus and stays in the stomach to be digested. If a person has gastroesophageal reflux disease (GERD), food and stomach acid move back up into the esophagus. When this happens, the esophagus becomes sore and swollen (inflamed). Over time, GERD can make small holes (ulcers) in the lining of the esophagus. Follow these instructions at home: Diet  Follow a diet as told by your doctor. You may need to avoid foods and drinks such as:  Coffee and tea (with or without caffeine).  Drinks that contain alcohol.  Energy drinks and sports drinks.  Carbonated drinks or sodas.  Chocolate and cocoa.  Peppermint and mint flavorings.  Garlic and onions.  Horseradish.  Spicy and acidic foods, such as peppers, chili powder, curry powder, vinegar, hot sauces, and BBQ sauce.  Citrus fruit juices and citrus fruits, such as oranges, lemons, and limes.  Tomato-based foods, such as red sauce, chili, salsa, and pizza with red sauce.  Fried and fatty foods, such as donuts, french fries, potato chips, and high-fat dressings.  High-fat meats, such as hot dogs, rib eye steak, sausage, ham, and bacon.  High-fat dairy items, such as whole milk, butter, and cream cheese.  Eat small meals often. Avoid eating large meals.  Avoid drinking large amounts of liquid with your meals.  Avoid eating meals during the 2-3 hours before bedtime.  Avoid lying down right after you eat.  Do not exercise right after you eat. General instructions  Pay attention to any changes in your symptoms.  Take over-the-counter and prescription medicines only as told by your doctor. Do not take aspirin, ibuprofen, or other NSAIDs unless your doctor says it is okay.  Do not use any tobacco products, including cigarettes, chewing tobacco, and e-cigarettes. If you need help quitting, ask your doctor.  Wear loose clothes. Do not wear anything  tight around your waist.  Raise (elevate) the head of your bed about 6 inches (15 cm).  Try to lower your stress. If you need help doing this, ask your doctor.  If you are overweight, lose an amount of weight that is healthy for you. Ask your doctor about a safe weight loss goal.  Keep all follow-up visits as told by your doctor. This is important. Contact a doctor if:  You have new symptoms.  You lose weight and you do not know why it is happening.  You have trouble swallowing, or it hurts to swallow.  You have wheezing or a cough that keeps happening.  Your symptoms do not get better with treatment.  You have a hoarse voice. Get help right away if:  You have pain in your arms, neck, jaw, teeth, or back.  You feel sweaty, dizzy, or light-headed.  You have chest pain or shortness of breath.  You throw up (vomit) and your throw up looks like blood or coffee grounds.  You pass out (faint).  Your poop (stool) is bloody or black.  You cannot swallow, drink, or eat. This information is not intended to replace advice given to you by your health care provider. Make sure you discuss any questions you have with your health care provider. Document Released: 05/03/2008 Document Revised: 04/22/2016 Document Reviewed: 03/12/2015  2017 Elsevier Gastroesophageal Reflux Disease, Adult Normally, food travels down the esophagus and stays in the stomach to be digested. However, when a person has gastroesophageal reflux disease (GERD), food and stomach acid move back up into the esophagus. When this happens, the  esophagus becomes sore and inflamed. Over time, GERD can create small holes (ulcers) in the lining of the esophagus. What are the causes? This condition is caused by a problem with the muscle between the esophagus and the stomach (lower esophageal sphincter, or LES). Normally, the LES muscle closes after food passes through the esophagus to the stomach. When the LES is weakened or  abnormal, it does not close properly, and that allows food and stomach acid to go back up into the esophagus. The LES can be weakened by certain dietary substances, medicines, and medical conditions, including:  Tobacco use.  Pregnancy.  Having a hiatal hernia.  Heavy alcohol use.  Certain foods and beverages, such as coffee, chocolate, onions, and peppermint. What increases the risk? This condition is more likely to develop in:  People who have an increased body weight.  People who have connective tissue disorders.  People who use NSAID medicines. What are the signs or symptoms? Symptoms of this condition include:  Heartburn.  Difficult or painful swallowing.  The feeling of having a lump in the throat.  Abitter taste in the mouth.  Bad breath.  Having a large amount of saliva.  Having an upset or bloated stomach.  Belching.  Chest pain.  Shortness of breath or wheezing.  Ongoing (chronic) cough or a night-time cough.  Wearing away of tooth enamel.  Weight loss. Different conditions can cause chest pain. Make sure to see your health care provider if you experience chest pain. How is this diagnosed? Your health care provider will take a medical history and perform a physical exam. To determine if you have mild or severe GERD, your health care provider may also monitor how you respond to treatment. You may also have other tests, including:  An endoscopy toexamine your stomach and esophagus with a small camera.  A test thatmeasures the acidity level in your esophagus.  A test thatmeasures how much pressure is on your esophagus.  A barium swallow or modified barium swallow to show the shape, size, and functioning of your esophagus. How is this treated? The goal of treatment is to help relieve your symptoms and to prevent complications. Treatment for this condition may vary depending on how severe your symptoms are. Your health care provider may  recommend:  Changes to your diet.  Medicine.  Surgery. Follow these instructions at home: Diet  Follow a diet as recommended by your health care provider. This may involve avoiding foods and drinks such as:  Coffee and tea (with or without caffeine).  Drinks that containalcohol.  Energy drinks and sports drinks.  Carbonated drinks or sodas.  Chocolate and cocoa.  Peppermint and mint flavorings.  Garlic and onions.  Horseradish.  Spicy and acidic foods, including peppers, chili powder, curry powder, vinegar, hot sauces, and barbecue sauce.  Citrus fruit juices and citrus fruits, such as oranges, lemons, and limes.  Tomato-based foods, such as red sauce, chili, salsa, and pizza with red sauce.  Fried and fatty foods, such as donuts, french fries, potato chips, and high-fat dressings.  High-fat meats, such as hot dogs and fatty cuts of red and white meats, such as rib eye steak, sausage, ham, and bacon.  High-fat dairy items, such as whole milk, butter, and cream cheese.  Eat small, frequent meals instead of large meals.  Avoid drinking large amounts of liquid with your meals.  Avoid eating meals during the 2-3 hours before bedtime.  Avoid lying down right after you eat.  Do not exercise  right after you eat. General instructions  Pay attention to any changes in your symptoms.  Take over-the-counter and prescription medicines only as told by your health care provider. Do not take aspirin, ibuprofen, or other NSAIDs unless your health care provider told you to do so.  Do not use any tobacco products, including cigarettes, chewing tobacco, and e-cigarettes. If you need help quitting, ask your health care provider.  Wear loose-fitting clothing. Do not wear anything tight around your waist that causes pressure on your abdomen.  Raise (elevate) the head of your bed 6 inches (15cm).  Try to reduce your stress, such as with yoga or meditation. If you need help  reducing stress, ask your health care provider.  If you are overweight, reduce your weight to an amount that is healthy for you. Ask your health care provider for guidance about a safe weight loss goal.  Keep all follow-up visits as told by your health care provider. This is important. Contact a health care provider if:  You have new symptoms.  You have unexplained weight loss.  You have difficulty swallowing, or it hurts to swallow.  You have wheezing or a persistent cough.  Your symptoms do not improve with treatment.  You have a hoarse voice. Get help right away if:  You have pain in your arms, neck, jaw, teeth, or back.  You feel sweaty, dizzy, or light-headed.  You have chest pain or shortness of breath.  You vomit and your vomit looks like blood or coffee grounds.  You faint.  Your stool is bloody or black.  You cannot swallow, drink, or eat. This information is not intended to replace advice given to you by your health care provider. Make sure you discuss any questions you have with your health care provider. Document Released: 08/25/2005 Document Revised: 04/14/2016 Document Reviewed: 03/12/2015 Elsevier Interactive Patient Education  2017 Elsevier Inc. EGD Discharge instructions Please read the instructions outlined below and refer to this sheet in the next few weeks. These discharge instructions provide you with general information on caring for yourself after you leave the hospital. Your doctor may also give you specific instructions. While your treatment has been planned according to the most current medical practices available, unavoidable complications occasionally occur. If you have any problems or questions after discharge, please call your doctor. ACTIVITY  You may resume your regular activity but move at a slower pace for the next 24 hours.   Take frequent rest periods for the next 24 hours.   Walking will help expel (get rid of) the air and reduce the  bloated feeling in your abdomen.   No driving for 24 hours (because of the anesthesia (medicine) used during the test).   You may shower.   Do not sign any important legal documents or operate any machinery for 24 hours (because of the anesthesia used during the test).  NUTRITION  Drink plenty of fluids.   You may resume your normal diet.   Begin with a light meal and progress to your normal diet.   Avoid alcoholic beverages for 24 hours or as instructed by your caregiver.  MEDICATIONS  You may resume your normal medications unless your caregiver tells you otherwise.  WHAT YOU CAN EXPECT TODAY  You may experience abdominal discomfort such as a feeling of fullness or gas pains.  FOLLOW-UP  Your doctor will discuss the results of your test with you.  SEEK IMMEDIATE MEDICAL ATTENTION IF ANY OF THE FOLLOWING OCCUR:  Excessive nausea (  feeling sick to your stomach) and/or vomiting.   Severe abdominal pain and distention (swelling).   Trouble swallowing.   Temperature over 101 F (37.8 C).   Rectal bleeding or vomiting of blood.   Increase Protonix to 40 mg twice daily  GERD information provided  Strive to lose 15 pounds between now and your next office visit with us  Office visit with us in 3 months  Your symptoms may be related to acid reflux although there could be other causes for your neck symptoms. You may need to see an ENT doctor eventually.

## 2016-12-27 NOTE — Transfer of Care (Signed)
Immediate Anesthesia Transfer of Care Note  Patient: Nancy Strickland  Procedure(s) Performed: Procedure(s) with comments: ESOPHAGOGASTRODUODENOSCOPY (EGD) WITH PROPOFOL (N/A) - [place] (N/A) BIOPSY - gastric and esophageal  Patient Location: PACU  Anesthesia Type:MAC  Level of Consciousness: awake and alert   Airway & Oxygen Therapy: Patient Spontanous Breathing  Post-op Assessment: Report given to RN  Post vital signs: Reviewed and stable  Last Vitals:  Vitals:   12/27/16 1000 12/27/16 1015  BP: 108/61 93/69  Resp: 15 13  Temp:      Last Pain:  Vitals:   12/27/16 0931  TempSrc: Oral  PainSc: 4       Patients Stated Pain Goal: 6 (04/08/01 1117)  Complications: No apparent anesthesia complications

## 2016-12-27 NOTE — H&P (View-Only) (Signed)
Primary Care Physician:  Colette RibasGOLDING, JOHN CABOT, MD Primary Gastroenterologist:  Dr. Jena Gaussourk   Chief Complaint  Patient presents with  . Dysphagia    x 2 yrs, getting worse. Hurts in esophagus    HPI:   Nancy Strickland is a 46 y.o. female presenting today at the request of her PCP secondary to dysphagia. She notes chronic symptoms for 2 years but worsening recently over past 2-3 months. Being treated for URI currently. Not better yet and has been on antibiotics for 8 days. Notes globus sensation as well. Rare indigestion but really just depends on what she eats. Tries to avoid foods that trigger it. Not on a reflux medication. If lays flat, feels like air is being cut off. Sometimes odynophagia. Taking Aleve once a day.   Notes significant weight gain in the past year. Has gained about 30 lbs in last 4 months. Feels like she may have gained about 40-50 lbs over past year.   [facility] noting an irregular Z-line, path noting focal intestinal metaplasia and concern for Barrett's, chronic active gastritis negative H.pylori, normal examined duodenum.  Remote colonoscopy, small hyperplastic polyps per patient report. Requesting records.   Past Medical History:  Diagnosis Date  . Medical history non-contributory     Past Surgical History:  Procedure Laterality Date  . ABDOMINAL HYSTERECTOMY    . CHOLECYSTECTOMY    . ESOPHAGOGASTRODUODENOSCOPY  2011   [facility]: irregular Z-line, path noting focal intestinal metaplasia and questioning Barrett's, chronic active gastritis negative H.pylori, normal examined duodenum.   Marland Kitchen. LAPAROSCOPY     several   . OOPHORECTOMY    . right breast lumpectomy     benign    Current Outpatient Prescriptions  Medication Sig Dispense Refill  . amoxicillin-clavulanate (AUGMENTIN) 875-125 MG tablet Take 1 tablet by mouth 2 (two) times daily. Started 11/24/16. For total of 10 days. Taking for Upper respiratory  infection.    . naproxen sodium (ANAPROX) 220 MG tablet Take 220 mg by mouth as needed.    Marland Kitchen. OVER THE COUNTER MEDICATION Mucinex liquid (pt unsure of exact name of med)    . pantoprazole (PROTONIX) 40 MG tablet Take 1 tablet (40 mg total) by mouth daily. Take 30 minutes prior to breakfast 30 tablet 3   No current facility-administered medications for this visit.     Allergies as of 12/03/2016 - Review Complete 12/03/2016  Allergen Reaction Noted  . Codeine Nausea And Vomiting 12/03/2016  . Hydrocodone Nausea And Vomiting 12/03/2016  . Latex Other (See Comments) 12/03/2016  . Oxycodone Nausea And Vomiting 12/03/2016  . Tramadol  12/03/2016  . Vicodin [hydrocodone-acetaminophen] Nausea And Vomiting 12/03/2016    Family History  Problem Relation Age of Onset  . Colon cancer Neg Hx     Social History   Social History  . Marital status: Single    Spouse name: N/A  . Number of children: N/A  . Years of education: N/A   Occupational History  . Not on file.   Social History Main Topics  . Smoking status: Current Every Day Smoker    Packs/day: 1.00    Years: 23.00  . Smokeless tobacco: Never Used  . Alcohol use No  . Drug use: No  . Sexual activity: Not on file   Other Topics Concern  . Not on file   Social History Narrative  . No narrative on file    Review of Systems:  As mentioned in HPI.   Physical Exam: BP 130/86   Pulse 83   Temp 98 F (36.7 C) (Oral)   Ht 5\' 3"  (1.6 m)   Wt 297 lb 9.6 oz (135 kg)   BMI 52.72 kg/m  General:   Alert and oriented. Pleasant and cooperative. Well-nourished and well-developed.  Head:  Normocephalic and atraumatic. Eyes:  Without icterus, sclera clear and conjunctiva pink.  Ears:  Normal auditory acuity. Nose:  No deformity, discharge,  or lesions. Mouth:  No deformity or lesions, oral mucosa pink.  Lungs:  Scattered rhonchi  Heart:  S1, S2 present without murmurs appreciated.  Abdomen:  +BS, soft, non-tender and  non-distended. No HSM noted. No guarding or rebound. No masses appreciated.  Rectal:  Deferred  Msk:  Symmetrical without gross deformities. Normal posture. Extremities:  Without  edema. Neurologic:  Alert and  oriented x4;  grossly normal neurologically. Psych:  Alert and cooperative. Normal mood and affect.

## 2016-12-28 ENCOUNTER — Encounter (HOSPITAL_COMMUNITY): Payer: Self-pay | Admitting: Internal Medicine

## 2016-12-30 ENCOUNTER — Encounter: Payer: Self-pay | Admitting: Internal Medicine

## 2016-12-31 ENCOUNTER — Telehealth: Payer: Self-pay

## 2016-12-31 NOTE — Telephone Encounter (Signed)
Per RMR- Send letter to patient.  Send copy of letter with path to referring provider and PCP.   Patient needs PrevPak or generic equivalent x 14 days--hold any acid suppression and/or statin therapy patient may be taking during treatment.  

## 2016-12-31 NOTE — Telephone Encounter (Signed)
Letter mailed to the pt. 

## 2017-01-06 ENCOUNTER — Telehealth: Payer: Self-pay | Admitting: Internal Medicine

## 2017-01-06 MED ORDER — AMOXICILL-CLARITHRO-LANSOPRAZ PO MISC: Freq: Two times a day (BID) | ORAL | 0 refills | Status: DC

## 2017-01-06 NOTE — Telephone Encounter (Signed)
Patient did not know that they were going to do a biopsy and she was on antibiotics prior to her endo and thinks there was a mix up   [phone]

## 2017-01-06 NOTE — Telephone Encounter (Signed)
rx has been sent to the pharmacy. 

## 2017-01-06 NOTE — Telephone Encounter (Signed)
Spoke with the pt, she had concerns about taking amox  and a Zpac prior to her procedure. Advised her that the bacteria requires a combination of amox and biaxin and prevacid in order to get rid of the bacteria and that amox by itself wouldn't take care of it. Pt also had concerns that she didn't remember being told that she had biopsies. I reviewed RMR procedure note and assured pt that biopsies were done. Pt is requesting copy of procedure note for her records. I will mail that information to her.

## 2017-01-06 NOTE — Telephone Encounter (Signed)
I spoke with the pt. See other phone note. 

## 2017-01-10 ENCOUNTER — Other Ambulatory Visit: Payer: Self-pay | Admitting: Endocrinology

## 2017-01-10 ENCOUNTER — Ambulatory Visit (INDEPENDENT_AMBULATORY_CARE_PROVIDER_SITE_OTHER): Payer: Self-pay | Admitting: Otolaryngology

## 2017-01-10 DIAGNOSIS — J343 Hypertrophy of nasal turbinates: Secondary | ICD-10-CM

## 2017-01-10 DIAGNOSIS — J310 Chronic rhinitis: Secondary | ICD-10-CM

## 2017-01-10 DIAGNOSIS — J342 Deviated nasal septum: Secondary | ICD-10-CM

## 2017-01-10 DIAGNOSIS — H6122 Impacted cerumen, left ear: Secondary | ICD-10-CM

## 2017-01-10 DIAGNOSIS — H6982 Other specified disorders of Eustachian tube, left ear: Secondary | ICD-10-CM

## 2017-01-10 DIAGNOSIS — E010 Iodine-deficiency related diffuse (endemic) goiter: Secondary | ICD-10-CM

## 2017-01-11 ENCOUNTER — Ambulatory Visit
Admission: RE | Admit: 2017-01-11 | Discharge: 2017-01-11 | Disposition: A | Discharge status: Home / Self Care | Payer: No Typology Code available for payment source | Source: Ambulatory Visit | Attending: Endocrinology | Admitting: Endocrinology

## 2017-01-11 DIAGNOSIS — E010 Iodine-deficiency related diffuse (endemic) goiter: Secondary | ICD-10-CM

## 2017-01-13 NOTE — Telephone Encounter (Signed)
T/C from Tam[facility] at [facility], said pt just called her and is short on the Prevacid that she was taking with Amoxicillin and Biaxin.  She did not read the instructions and she has been taking the Prevacid four times a day.  Tammy wants to know if you want to give another Rx for the Prevacid of if you want pt to take protonix that she already had.  Pt is willing to buy more Prevacid if recommended.   Please advise!

## 2017-01-13 NOTE — Telephone Encounter (Signed)
Give her enough Prevacid 30 mg capsules-taking one twice daily to finish up treatment regimen. Do not miss any doses of the other parts of it

## 2017-01-13 NOTE — Telephone Encounter (Signed)
Gabino [facility]agin [facility] is aware.

## 2017-03-21 ENCOUNTER — Ambulatory Visit: Payer: Self-pay | Admitting: Nurse Practitioner

## 2017-06-14 ENCOUNTER — Other Ambulatory Visit: Payer: Self-pay | Admitting: Endocrinology

## 2017-06-14 DIAGNOSIS — E010 Iodine-deficiency related diffuse (endemic) goiter: Secondary | ICD-10-CM

## 2017-06-20 ENCOUNTER — Other Ambulatory Visit: Payer: Self-pay

## 2017-06-24 ENCOUNTER — Ambulatory Visit
Admission: RE | Admit: 2017-06-24 | Discharge: 2017-06-24 | Disposition: A | Discharge status: Home / Self Care | Payer: No Typology Code available for payment source | Source: Ambulatory Visit | Attending: Endocrinology | Admitting: Endocrinology

## 2017-06-24 DIAGNOSIS — E010 Iodine-deficiency related diffuse (endemic) goiter: Secondary | ICD-10-CM

## 2017-12-30 ENCOUNTER — Encounter: Payer: Self-pay | Admitting: Neurology

## 2017-12-30 ENCOUNTER — Ambulatory Visit: Payer: Self-pay | Admitting: Neurology

## 2017-12-30 VITALS — BP 143/93 | HR 89 | Ht 63.0 in | Wt 306.0 lb

## 2017-12-30 DIAGNOSIS — R413 Other amnesia: Secondary | ICD-10-CM

## 2017-12-30 DIAGNOSIS — G479 Sleep disorder, unspecified: Secondary | ICD-10-CM

## 2017-12-30 MED ORDER — TOPIRAMATE 25 MG PO TABS: ORAL_TABLET | ORAL | 3 refills | Status: DC

## 2017-12-30 NOTE — Patient Instructions (Signed)
   We will check MRI of the brain and get a sleep evaluation.  We will start Topamax for the headache.  Topamax (topiramate) is a seizure medication that has an FDA approval for seizures and for migraine headache. Potential side effects of this medication include weight loss, cognitive slowing, tingling in the fingers and toes, and carbonated drinks will taste bad. If any significant side effects are noted on this drug, please contact our office.

## 2017-12-30 NOTE — Progress Notes (Signed)
Reason for visit: Headache, memory disturbance  Referring physician: Dr. Derenda Mis is a 47 y.o. female  History of present illness:  Nancy Strickland is a 47 year old right-handed white female with a history of morbid obesity.  The patient indicates that 2 years ago she began gaining significant amounts of weight, she gained 50 pounds in 6 months, she has continued to gain weight since that time.  Within the last year and a half she has had onset of headaches that are mainly in the left periorbital area and the headaches are daily in nature.  She has also developed some troubles with memory around the same time.  She indicates a chronic history of insomnia alternating with hypersomnolence.  More recently she has had difficulty with sleeping, she will sleep only about 2 hours and then wake up and cannot get back to sleep.  She indicates that she has to sleep with her head elevated otherwise she obstructs her airway when she lies flat.  She does snore at night.  The patient reports that during the day she has significant drowsiness, particularly in the afternoon and she has difficulty staying awake.  The patient has begun having troubles with memory and concentration.  She has stopped driving a car because she has difficulty with directions with driving.  She has difficulty with focusing.  She has prediabetes, she has had some numbness and tingling in the hands and feet.  She indicates that her balance is slightly off, she does have some dizziness at times.  She denies issues controlling the bladder but she does have alternating constipation and diarrhea.  She denies any family history of headaches.  She did have a maternal grandmother with Alzheimer's disease that began in her 79s.  The patient is sent to this office for further evaluation.  Past Medical History:  Diagnosis Date  . Anemia    During pregnancy  . Depression   . Headache   . Medical history non-contributory     Past  Surgical History:  Procedure Laterality Date  . ABDOMINAL HYSTERECTOMY    . BIOPSY  12/27/2016   Procedure: BIOPSY;  Surgeon: Corbin Ade, MD;  [facility];  Service: Endoscopy;;  gastric and esophageal  . CHOLECYSTECTOMY    . COLONOSCOPY    . ESOPHAGOGASTRODUODENOSCOPY (EGD) WITH PROPOFOL N/A 12/27/2016   Procedure: ESOPHAGOGASTRODUODENOSCOPY (EGD) WITH PROPOFOL;  Surgeon: Corbin Ade, MD;  [facility];  Service: Endoscopy;  Laterality: N/A;  945  . LAPAROSCOPY     several   . MALONEY DILATION N/A 12/27/2016   Procedure: Elease Hashimoto DILATION;  Surgeon: Corbin Ade, MD;  [facility];  Service: Endoscopy;  Laterality: N/A;  . OOPHORECTOMY    . right breast lumpectomy     benign    Family History  Problem Relation Age of Onset  . Colon cancer Neg Hx     Social history:  reports that she has been smoking.  She has a 23.00 pack-year smoking history. she has never used smokeless tobacco. She reports that she does not drink alcohol or use drugs.  Medications:  Prior to Admission medications   Medication Sig Start Date End Date Taking? Authorizing Provider  diphenhydrAMINE (BENADRYL) 25 MG tablet Take 25 mg by mouth daily.   Yes [provider]  naproxen sodium (ANAPROX) 220 MG tablet Take 440 mg by mouth daily as needed (pain).    Yes [provider]  topiramate (  TOPAMAX) 25 MG tablet Take one tablet at night for one week, then take 2 tablets at night for one week, then take 3 tablets at night. 12/30/17   York SpanielWillis, Faatimah Spielberg K, MD      Allergies  Allergen Reactions  . Codeine Nausea And Vomiting    Vomiting   . Hydrocodone Nausea And Vomiting    Violent vomititn  . Latex Other (See Comments)    Pulmonary-shortness of breath. If contact with skin gets a rash  . Oxycodone Nausea And Vomiting    Vomiting   . Tramadol     Upsets stomach. Can tolerate if needed.  . Vicodin [Hydrocodone-Acetaminophen] Nausea And Vomiting     Vomiting     ROS:  Out of a complete 14 system review of symptoms, the patient complains only of the following symptoms, and all other reviewed systems are negative.  Fevers, chills, weight gain Swelling in the legs Difficulty swallowing Skin rash, itching Blurred vision, loss of vision Shortness of breath, cough, snoring Incontinence of the bowels, diarrhea Feeling hot, cold, increased thirst Joint pain, joint swelling, aching muscles Skin sensitivity Memory loss, confusion, headache, difficulty swallowing Depression, decreased energy, change in appetite, disinterest in activities Sleepiness, restless legs  Blood pressure (!) 143/93, pulse 89, height 5\' 3"  (1.6 m), weight (!) 306 lb (138.8 kg).  Physical Exam  General: The patient is alert and cooperative at the time of the examination.  The patient is morbidly obese.  Eyes: Pupils are equal, round, and reactive to light. Discs are flat bilaterally.  No evidence of papilledema is seen, no venous pulsations are noted.  Neck: The neck is supple, no carotid bruits are noted.  Respiratory: The respiratory examination is clear.  Cardiovascular: The cardiovascular examination reveals a regular rate and rhythm, no obvious murmurs or rubs are noted.  Skin: Extremities are without significant edema.  Neurologic Exam  Mental status: The patient is alert and oriented x 3 at the time of the examination. The patient has apparent normal recent and remote memory, with an apparently normal attention span and concentration ability.  The Mini-Mental status examination done today shows a total score of 29/30.  Cranial nerves: Facial symmetry is present. There is good sensation of the face to pinprick and soft touch bilaterally. The strength of the facial muscles and the muscles to head turning and shoulder shrug are normal bilaterally. Speech is well enunciated, no aphasia or dysarthria is noted. Extraocular movements are full. Visual fields  are full. The tongue is midline, and the patient has symmetric elevation of the soft palate. No obvious hearing deficits are noted.  Motor: The motor testing reveals 5 over 5 strength of all 4 extremities. Good symmetric motor tone is noted throughout.  Sensory: Sensory testing is intact to pinprick, soft touch, vibration sensation, and position sense on all 4 extremities. No evidence of extinction is noted.  Coordination: Cerebellar testing reveals good finger-nose-finger and heel-to-shin bilaterally.  Gait and station: Gait is normal. Tandem gait is normal. Romberg is negative. No drift is seen.  Reflexes: Deep tendon reflexes are symmetric, but are depressed bilaterally. Toes are downgoing bilaterally.   Assessment/Plan:  1.  Morbid obesity  2.  Chronic daily headache  3.  Memory disturbance  4.  Excessive daytime drowsiness  The patient has had a significant weight gain issue over the last 2 years, she has developed headaches and memory problems, she is not sleeping well.  The patient likely has a memory disturbance related to the  sleep issue.  She claims that when she lies flat she will obstruct her airway, she has to sleep with her head elevated.  The patient will be sent for a sleep evaluation.  The patient will undergo MRI of the brain, she will have blood work done today.  We will initiate treatment for the headache to include Topamax.  She will need to look out for worsening cognitive dysfunction on this medication.  She will follow-up in 3 months.  Nancy Palau MD 12/30/2017 12:12 PM  [facility] [address] [place], [place] [address]  Phone [phone] Fax [phone]

## 2017-12-31 LAB — HIV ANTIBODY (ROUTINE TESTING W REFLEX): HIV SCREEN 4TH GENERATION: NONREACTIVE

## 2017-12-31 LAB — RPR: RPR Ser Ql: NONREACTIVE

## 2017-12-31 LAB — SEDIMENTATION RATE: SED RATE: 63 mm/h — AB (ref 0–32)

## 2017-12-31 LAB — VITAMIN B12: Vitamin B-12: 520 pg/mL (ref 232–1245)

## 2018-01-02 ENCOUNTER — Telehealth: Payer: Self-pay | Admitting: *Deleted

## 2018-01-02 NOTE — Telephone Encounter (Signed)
-----  Message from Kathrynn Ducking, MD sent at 01/01/2018  2:44 PM EST -----  The blood work results are unremarkable, with exception of an elevated ESR, we will need to recheck. Please call the patient. ----- Message ----- From: Lavone Neri Lab Results In Sent: 12/31/2017   5:41 AM To: Kathrynn Ducking, MD

## 2018-01-02 NOTE — Telephone Encounter (Signed)
Called and LVM for pt (ok per DPR) about lab results per CW,MD note. Advised we will recheck ESR (sedimentation rate) at her next follow up in may to continue to follow this. Gave [facility] phone number if she had further questions/concerns.

## 2018-01-08 ENCOUNTER — Ambulatory Visit
Admission: RE | Admit: 2018-01-08 | Discharge: 2018-01-08 | Disposition: A | Discharge status: Home / Self Care | Payer: No Typology Code available for payment source | Source: Ambulatory Visit | Attending: Neurology | Admitting: Neurology

## 2018-01-08 DIAGNOSIS — R413 Other amnesia: Secondary | ICD-10-CM

## 2018-01-09 ENCOUNTER — Telehealth: Payer: Self-pay | Admitting: Neurology

## 2018-01-09 NOTE — Telephone Encounter (Signed)
  I called the patient.  MRI of the brain is unremarkable.  She will call for any dose adjustments of the Topamax.   MRI brain 01/08/18:  IMPRESSION: Unremarkable MRI scan of the brain without contrast.

## 2018-01-24 ENCOUNTER — Ambulatory Visit: Payer: Self-pay | Admitting: Neurology

## 2018-01-24 ENCOUNTER — Encounter: Payer: Self-pay | Admitting: Neurology

## 2018-01-24 VITALS — BP 138/82 | HR 102 | Ht 63.0 in | Wt 302.0 lb

## 2018-01-24 DIAGNOSIS — Z6841 Body Mass Index (BMI) 40.0 and over, adult: Secondary | ICD-10-CM

## 2018-01-24 DIAGNOSIS — R0683 Snoring: Secondary | ICD-10-CM

## 2018-01-24 DIAGNOSIS — R51 Headache: Secondary | ICD-10-CM

## 2018-01-24 DIAGNOSIS — F17200 Nicotine dependence, unspecified, uncomplicated: Secondary | ICD-10-CM

## 2018-01-24 DIAGNOSIS — R351 Nocturia: Secondary | ICD-10-CM

## 2018-01-24 DIAGNOSIS — R519 Headache, unspecified: Secondary | ICD-10-CM

## 2018-01-24 NOTE — Patient Instructions (Addendum)
Thank you for choosing [facility] for your sleep related care! It was nice to meet you today! I appreciate that you entrust me with your sleep related healthcare concerns. I hope, I was able to address at least some of your concerns today, and that I can help you feel reassured and also get better.    Here is what we discussed today and what we came up with as our plan for you:    Based on your symptoms and your exam I believe you are at risk for obstructive sleep apnea or OSA, and I think we should proceed with a sleep study to determine whether you do or do not have OSA and how severe it is. If you have more than mild OSA, I want you to consider treatment with CPAP. Please remember, the risks and ramifications of moderate to severe obstructive sleep apnea or OSA are: Cardiovascular disease, including congestive heart failure, stroke, difficult to control hypertension, arrhythmias, and even type 2 diabetes has been linked to untreated OSA. Sleep apnea causes disruption of sleep and sleep deprivation in most cases, which, in turn, can cause recurrent headaches, problems with memory, mood, concentration, focus, and vigilance. Most people with untreated sleep apnea report excessive daytime sleepiness, which can affect their ability to drive. Please do not drive if you feel sleepy. Please work on smoking cessation.   I will likely see you back after your sleep study to go over the test results and where to go from there. We will call you after your sleep study to advise about the results (most likely, you will hear from ChoteauKristen, my nurse) and to set up an appointment at the time, as necessary.    Our sleep lab administrative assistant will call you to schedule your sleep study. If you don't hear back from her by about 2 weeks from now, please feel free to call her at [phone]. You can leave a message with your phone number and concerns, if you get the voicemail box. She will call back  as soon as possible.

## 2018-01-24 NOTE — Progress Notes (Signed)
Subjective:    Patient ID: Nancy Strickland is a 47 y.o. female.  HPI     Huston Foley, MD, PhD [facility] [address], [address] [address] [place], [place] [address]  Dear Mellody Dance,   I saw your patient, Nancy Strickland, upon your kind request in my clinic today for initial consultation of her sleep disorder, in particular, concern for underlying obstructive sleep apnea. The patient is unaccompanied today. As you know, Nancy Strickland is a 47 year old right-handed woman with an underlying medical history of headache, depression, anemia, memory disturbance, smoking and morbid obesity with BMI of over 50, who reports snoring and excessive daytime somnolence. I reviewed your office note from 12/30/2017. Her Epworth sleepiness score is 8 out of 24, fatigue score is 55 out of 63. She lives with her husband and her son. She does not work. She smokes about a pack per day and drinks alcohol occasionally, drinks caffeine in the form of coffee, 2-3 cups per day, generally speaking.        She has been gaining weight. Mother had sleep apnea. She has not had a set bedtime or rise time. She is typically out of bed by 9. It may take her hours to fall asleep. She has nocturia about once per average night, she reports frequent morning headaches. She has been gaining weight. She has woken up with a sense of choking. She has been sleeping with her feet up and head of bed elevated for the past 2 years or so. She bought a wedge for her bed.   Her Past Medical History Is Significant For: Past Medical History:  Diagnosis Date  . Anemia    During pregnancy  . Depression   . Headache   . Medical history non-contributory     Her Past Surgical History Is Significant For: Past Surgical History:  Procedure Laterality Date  . ABDOMINAL HYSTERECTOMY    . BIOPSY  12/27/2016   Procedure: BIOPSY;  Surgeon: Corbin Ade, MD;  [facility];  Service: Endoscopy;;  gastric and esophageal   . CHOLECYSTECTOMY    . COLONOSCOPY    . ESOPHAGOGASTRODUODENOSCOPY (EGD) WITH PROPOFOL N/A 12/27/2016   Procedure: ESOPHAGOGASTRODUODENOSCOPY (EGD) WITH PROPOFOL;  Surgeon: Corbin Ade, MD;  [facility];  Service: Endoscopy;  Laterality: N/A;  945  . LAPAROSCOPY     several   . MALONEY DILATION N/A 12/27/2016   Procedure: Elease Hashimoto DILATION;  Surgeon: Corbin Ade, MD;  [facility];  Service: Endoscopy;  Laterality: N/A;  . OOPHORECTOMY    . right breast lumpectomy     benign    Her Family History Is Significant For: Family History  Problem Relation Age of Onset  . Colon cancer Neg Hx     Her Social History Is Significant For: Social History   Socioeconomic History  . Marital status: Married    Spouse name: Tammy Sours  . Number of children: 1  . Years of education: 42  . Highest education level: None  Social Needs  . Financial resource strain: None  . Food insecurity - worry: None  . Food insecurity - inability: None  . Transportation needs - medical: None  . Transportation needs - non-medical: None  Occupational History  . None  Tobacco Use  . Smoking status: Current Every Day Smoker    Packs/day: 1.00    Years: 23.00    Pack years: 23.00  . Smokeless tobacco: Never Used  Substance and  Sexual Activity  . Alcohol use: No  . Drug use: No  . Sexual activity: None  Other Topics Concern  . None  Social History Narrative   Lives with husband   Caffeine use: 2-3 cups per day   Right handed     Her Allergies Are:  Allergies  Allergen Reactions  . Codeine Nausea And Vomiting    Vomiting   . Hydrocodone Nausea And Vomiting    Violent vomititn  . Latex Other (See Comments)    Pulmonary-shortness of breath. If contact with skin gets a rash  . Oxycodone Nausea And Vomiting    Vomiting   . Tramadol     Upsets stomach. Can tolerate if needed.  . Vicodin [Hydrocodone-Acetaminophen] Nausea And Vomiting    Vomiting   :   Her Current  Medications Are:  Outpatient Encounter Medications as of 01/24/2018  Medication Sig  . diphenhydrAMINE (BENADRYL) 25 MG tablet Take 25 mg by mouth daily.  . naproxen sodium (ANAPROX) 220 MG tablet Take 440 mg by mouth daily as needed (pain).   . topiramate (TOPAMAX) 25 MG tablet Take one tablet at night for one week, then take 2 tablets at night for one week, then take 3 tablets at night.   No facility-administered encounter medications on file as of 01/24/2018.   :  Review of Systems:  Out of a complete 14 point review of systems, all are reviewed and negative with the exception of these symptoms as listed below: Review of Systems  Neurological:       Pt presents today to discuss her sleep. Pt has never had a sleep study but does endorse snoring. Pt is complaining of a migraine.  Epworth Sleepiness Scale 0= would never doze 1= slight chance of dozing 2= moderate chance of dozing 3= high chance of dozing  Sitting and reading: 1 Watching TV: 2 Sitting inactive in a public place (ex. Theater or meeting): 0 As a passenger in a car for an hour without a break: 3 Lying down to rest in the afternoon: 2 Sitting and talking to someone: 0 Sitting quietly after lunch (no alcohol): 0 In a car, while stopped in traffic: 0 Total: 8     Objective:  Neurological Exam  Physical Exam Physical Examination:   Vitals:   01/24/18 1348  BP: 138/82  Pulse: (!) 102    General Examination: The patient is a very pleasant 47 y.o. female in no acute distress. She reports having migraine. She wears dark eyeglasses. She requested for me to whisper, rather than talk normally, I did my best to speak softly.    HEENT: Normocephalic, atraumatic, hearing is grossly intact. Face is symmetric with normal facial animation and normal facial sensation. Speech is clear with no dysarthria noted. There is no hypophonia. There is no lip, neck/head, jaw or voice tremor. There are no carotid bruits on auscultation.  Oropharynx exam reveals: mild mouth dryness, adequate dental hygiene and moderate airway crowding, due to smaller airway entry, tonsils about 1+ bilaterally, redundant soft palate. Mallampati is class II. Neck circumference is 17-1/4 inches, she has minimal overbite.  Chest: Clear to auscultation without wheezing, rhonchi or crackles noted.  Heart: S1+S2+0, regular and normal without murmurs, rubs or gallops noted.   Abdomen: Soft, non-tender and non-distended with normal bowel sounds appreciated on auscultation.  Extremities: There is obvious abnormality.   Skin: Warm and dry without trophic changes noted.  Musculoskeletal: exam reveals no obvious joint deformities.   Neurologically:  Mental  status: The patient is awake, alert and oriented in all 4 spheres. Her immediate and remote memory, attention, language skills and fund of knowledge are appropriate. There is no evidence of aphasia, agnosia, apraxia or anomia. Speech is clear with normal prosody and enunciation. Thought process is linear. Mood is normal and affect is constricted.  Cranial nerves II - XII are as described above under HEENT exam. In addition: shoulder shrug is normal with equal shoulder height noted. Motor exam: Normal bulk, strength and tone is noted. There is no resting tremor. Fine motor skills and coordination: grossly intact.  Cerebellar testing: No dysmetria or intention tremor on finger to nose testing. Heel to shin is unremarkable bilaterally. There is no truncal or gait ataxia.  Sensory exam: intact to light touch in the upper and lower extremities.   Assessment and Plan:  In summary, JAMY WHYTE is a very pleasant 47 y.o.-year old female with an underlying medical history of headache, depression, anemia, memory disturbance, smoking and morbid obesity with BMI of over 50, whose history and physical exam are concerning for obstructive sleep apnea (OSA). I had a long chat with the patient  about my findings and  the diagnosis of OSA, its prognosis and treatment options. We talked about medical treatments, surgical interventions and non-pharmacological approaches. I explained in particular the risks and ramifications of untreated moderate to severe OSA, especially with respect to developing cardiovascular disease down the Road, including congestive heart failure, difficult to treat hypertension, cardiac arrhythmias, or stroke. Even type 2 diabetes has, in part, been linked to untreated OSA. Symptoms of untreated OSA include daytime sleepiness, memory problems, mood irritability and mood disorder such as depression and anxiety, lack of energy, as well as recurrent headaches, especially morning headaches. We talked about smoking cessation and trying to maintain a healthy lifestyle in general, as well as the importance of weight control. I advised the patient not to drive when feeling sleepy. I recommended the following at this time: sleep study with potential positive airway pressure titration. (We will score hypopneas at 3%).   I explained the sleep test procedure to the patient and also outlined possible surgical and non-surgical treatment options of OSA, including the use of a custom-made dental device and the use of CPAP. She indicated that she would be willing to try CPAP if the need arises. I answered all her questions today and the patient was in agreement. I will likely see her back after the sleep study is completed and encouraged her to call with any interim questions, concerns, problems or updates.   Thank you very much for allowing me to participate in the care of this nice patient. If I can be of any further assistance to you please do not hesitate to talk to me.  Sincerely,   Huston Foley, MD, PhD

## 2018-01-25 ENCOUNTER — Telehealth: Payer: Self-pay | Admitting: Neurology

## 2018-01-25 MED ORDER — TOPIRAMATE 50 MG PO TABS: 150.0000 mg | ORAL_TABLET | Freq: Every day | ORAL | 3 refills | Status: AC

## 2018-01-25 NOTE — Telephone Encounter (Signed)
Called and spoke with patient. She was referring to topamax 25mg . She started on three tablets at bed last week and has not noticed any benefit. I did advise that she does need to give it a few weeks to notice max benefit but I will send message to Dr. Anne HahnWillis to see if he would like to do anything different. She verbalized understanding.

## 2018-01-25 NOTE — Addendum Note (Signed)
Addended by: York SpanielWILLIS, CHARLES K on: 01/25/2018 05:50 PM   Modules accepted: Orders

## 2018-01-25 NOTE — Telephone Encounter (Signed)
I called the patient, left a message, I will call back later. 

## 2018-01-25 NOTE — Telephone Encounter (Signed)
I called the patient.  The patient is not having any side effects on the Topamax, we will go to 100 mg at night and then begin 150 mg at night.  I will call in the 50 mg tablets.

## 2018-01-25 NOTE — Telephone Encounter (Signed)
While speaking w/ Mrs. Nancy Strickland about her sleep study, she requested that I put in a note for Dr. Anne HahnWillis' nurse to call her. She stated that her medication is not working.

## 2018-02-05 ENCOUNTER — Ambulatory Visit (INDEPENDENT_AMBULATORY_CARE_PROVIDER_SITE_OTHER): Payer: Self-pay | Admitting: Neurology

## 2018-02-05 DIAGNOSIS — R519 Headache, unspecified: Secondary | ICD-10-CM

## 2018-02-05 DIAGNOSIS — R0683 Snoring: Secondary | ICD-10-CM

## 2018-02-05 DIAGNOSIS — G4733 Obstructive sleep apnea (adult) (pediatric): Secondary | ICD-10-CM

## 2018-02-05 DIAGNOSIS — R51 Headache: Secondary | ICD-10-CM

## 2018-02-05 DIAGNOSIS — R351 Nocturia: Secondary | ICD-10-CM

## 2018-02-05 DIAGNOSIS — F17200 Nicotine dependence, unspecified, uncomplicated: Secondary | ICD-10-CM

## 2018-02-05 DIAGNOSIS — G4720 Circadian rhythm sleep disorder, unspecified type: Secondary | ICD-10-CM

## 2018-02-05 DIAGNOSIS — Z6841 Body Mass Index (BMI) 40.0 and over, adult: Secondary | ICD-10-CM

## 2018-02-10 ENCOUNTER — Other Ambulatory Visit: Payer: Self-pay | Admitting: Neurology

## 2018-02-10 DIAGNOSIS — G4733 Obstructive sleep apnea (adult) (pediatric): Secondary | ICD-10-CM

## 2018-02-10 NOTE — Progress Notes (Signed)
Patient referred by Dr. Anne HahnWillis, seen by me on 01/24/18, split night sleep study on 02/05/18. Please call and notify patient that the recent sleep study confirmed the diagnosis of moderate to severe OSA. She did well with CPAP during the study with significant improvement of the respiratory events. Therefore, I would like start the patient on CPAP therapy at home by prescribing a machine for home use. I placed the order in the chart.  Please advise patient that we need a follow up appointment with either myself or one of our nurse practitioners in about 10 weeks post set-up to check for how the patient is feeling and how well the patient is using the machine, etc. Please go ahead and schedule the appointment, while you have the patient on the phone and make sure patient understands the importance of keeping this window for the FU appointment, as it is often an insurance requirement. Failing to adhere to this may result in losing coverage for sleep apnea treatment, at which point most patients are left with a choice of returning the machine or paying out of pocket (and we want neither of this to happen!).  Please re-enforce the importance of compliance with treatment and the need for us to monitor compliance data - again an insurance requirement and usually a good feedback for the patient as far as how they are doing.  Also remind patient, that any PAP machine or mask issues should be first addressed with the DME company, who provided the machine/mask.  Please ask if patient has a preference regarding DME company, may depend on the insurance too.  Please arrange for CPAP set up at home through a DME company of patient's choice.  Once you have spoken to the patient you can close the phone encounter. Please fax/route report to referring provider, thanks,   Huston FoleySaima Bryn Saline, MD, PhD [facility])

## 2018-02-10 NOTE — Procedures (Signed)
PATIENT'S NAME:  Nancy Strickland, Nancy Strickland DOB:      [birth year]      MR#:    [id number]     DATE OF RECORDING: 02/05/2018 REFERRING M.D.: Dr. Stephanie Acre,           PCP: Assunta Found, MD Study Performed:  Split-Night Titration Study HISTORY: 47 year old female with a history of headache, depression, anemia, memory disturbance, smoking and morbid obesity with BMI of over 50, who reports snoring and daytime sleepiness. Her Epworth Sleepiness Score is 8/4 points. BMI of 53.5 kg/m2. The patient's neck circumference measured 17.2 inches.  CURRENT MEDICATIONS: Benadryl, Anaprox, Topamax  PROCEDURE:  This is a multichannel digital polysomnogram utilizing the Somnostar 11.2 system.  Electrodes and sensors were applied and monitored per AASM Specifications.   EEG, EOG, Chin and Limb EMG, were sampled at 200 Hz.  ECG, Snore and Nasal Pressure, Thermal Airflow, Respiratory Effort, CPAP Flow and Pressure, Oximetry was sampled at 50 Hz. Digital video and audio were recorded.      BASELINE STUDY WITHOUT CPAP RESULTS:  Lights Out was at 21:11 and Lights On at 04:57 for the night, split study started at 01:24, epoch 512. Total recording time (TRT) was 251, with a total sleep time (TST) of 170 minutes. The patient's sleep latency was 65.5 minutes, which is delayed. REM latency was 127 minutes.  The sleep efficiency was 67.7 %.    SLEEP ARCHITECTURE: WASO (Wake after sleep onset) was 16 minutes, Stage N1 was 4 minutes, Stage N2 was 110 minutes, Stage N3 was 4.5 minutes and Stage R (REM sleep) was 51.5 minutes.  The percentages were Stage N1 2.4%, Stage N2 64.7%, Stage N3 2.6% and Stage R (REM sleep) 30.3%. The arousals were noted as: 21 were spontaneous, 0 were associated with PLMs, 16 were associated with respiratory events. Audio and video analysis did not show any abnormal or unusual movements, behaviors, phonations or vocalizations. The patient took no bathroom breaks. Snoring was noted. The EKG was in keeping with  normal sinus rhythm (NSR).  RESPIRATORY ANALYSIS:  There were a total of 43 respiratory events:  1 obstructive apneas, 0 central apneas and 0 mixed apneas with a total of 1 apneas and an apnea index (AI) of .4. There were 42 hypopneas with a hypopnea index of 14.8. The patient also had 0 respiratory event related arousals (RERAs).  Snoring was noted.     The total APNEA/HYPOPNEA INDEX (AHI) was 15.2 /hour and the total RESPIRATORY DISTURBANCE INDEX was 15.2 /hour.  43 events occurred in REM sleep and 0 events in NREM. The REM AHI was 50.1, /hour versus a non-REM AHI of 0 /hour. The patient spent 289.5 minutes sleep time in the supine position 0 minutes in non-supine. The supine AHI was 15.2 /hour versus a non-supine AHI of 0.0 /hour.  OXYGEN SATURATION & C02:  The wake baseline 02 saturation was 94%, with the lowest being 80%. Time spent below 89% saturation equaled 27 minutes.  PERIODIC LIMB MOVEMENTS: The patient had a total of 0 Periodic Limb Movements.  The Periodic Limb Movement (PLM) index was 0 /hour and the PLM Arousal index was 0 /hour.  TITRATION STUDY WITH CPAP RESULTS:   The patient was fitted with a small Simplus FFM. CPAP was initiated at 5 cmH20 with heated humidity per AASM split night standards and pressure was advanced to 11 cmH20 because of hypopneas, apneas and desaturations. At a PAP pressure of 10 cmH20, there was a reduction of the AHI to  0/hour with supine NREM sleep achieved, O2 nadir of 91%.   Total recording time (TRT) was 215.5 minutes, with a total sleep time (TST) of 119.5 minutes. The patient's sleep latency was 92 minutes, which is markedly delayed. REM latency was 50.5 minutes.  The sleep efficiency was 55.5 %.    SLEEP ARCHITECTURE: Wake after sleep was 4 minutes, Stage N1 1 minutes, Stage N2 107.5 minutes, Stage N3 0 minutes and Stage R (REM sleep) 11 minutes. The percentages were: Stage N1 .8%, Stage N2 90.%, Stage N3 was absent and Stage R (REM sleep) was 9.2%.  The arousals were noted as: 3 were spontaneous, 0 were associated with PLMs, 0 were associated with respiratory events.  RESPIRATORY ANALYSIS:  There were a total of 0 respiratory events: 0 obstructive apneas, 0 central apneas and 0 mixed apneas with a total of 0 apneas and an apnea index (AI) of 0. There were 0 hypopneas with a hypopnea index of 0 /hour. The patient also had 0 respiratory event related arousals (RERAs).      The total APNEA/HYPOPNEA INDEX  (AHI) was 0 /hour and the total RESPIRATORY DISTURBANCE INDEX was 0 /hour.  0 events occurred in REM sleep and 0 events in NREM. The REM AHI was 0 /hour versus a non-REM AHI of 0 /hour. REM sleep was achieved on a pressure of  cm/h2o (AHI was  .) The patient spent 100% of total sleep time in the supine position. The supine AHI was 0.0 /hour, versus a non-supine AHI of 0.0/hour.  OXYGEN SATURATION & C02:  The wake baseline 02 saturation was 94%, with the lowest being 89%. Time spent below 89% saturation equaled 0 minutes.  PERIODIC LIMB MOVEMENTS: The patient had a total of 0 Periodic Limb Movements. The Periodic Limb Movement (PLM) index was 0 /hour and the PLM Arousal index was 0 /hour.  Post-study, the patient indicated that sleep was the same as usual.  POLYSOMNOGRAPHY IMPRESSION :   1. Obstructive Sleep Apnea (OSA)  2. Dysfunctions associated with sleep stages or arousals from sleep  RECOMMENDATIONS:  1. This patient has moderate to severe obstructive sleep apnea and responded well on CPAP therapy. I will, therefore, start the patient on home CPAP treatment at a pressure of 10 cm via small FFM, with heated humidity. The patient should be reminded to be fully compliant with PAP therapy to improve sleep related symptoms and decrease long term cardiovascular risks. Please note that untreated obstructive sleep apnea carries additional perioperative morbidity. Patients with significant obstructive sleep apnea should receive perioperative PAP  therapy and the surgeons and particularly the anesthesiologist should be informed of the diagnosis and the severity of the sleep disordered breathing. 2. This study shows sleep fragmentation and abnormal sleep stage percentages; these are nonspecific findings and per se do not signify an intrinsic sleep disorder or a cause for the patient's sleep-related symptoms. Causes include (but are not limited to) the first night effect of the sleep study, circadian rhythm disturbances, medication effect or an underlying mood disorder or medical problem.  3. The patient should be cautioned not to drive, work at heights, or operate dangerous or heavy equipment when tired or sleepy. Review and reiteration of good sleep hygiene measures should be pursued with any patient. 4. The patient will be seen in follow-up by Dr. Frances Furbish at [facility] for discussion of the test results and further management strategies. The referring provider will be notified of the test results.  I certify that I have reviewed  the entire raw data recording prior to the issuance of this report in accordance with the Standards of Accreditation of the American Academy of Sleep Medicine (AASM)   Huston FoleySaima Sakib Noguez, MD, PhD Diplomat, American Board of Psychiatry and Neurology (Neurology and Sleep Medicine)

## 2018-02-13 ENCOUNTER — Telehealth: Payer: Self-pay

## 2018-02-13 NOTE — Telephone Encounter (Signed)
-----   Message from Huston FoleySaima Athar, MD sent at 02/10/2018 12:51 PM EDT ----- Patient referred by Dr. Anne HahnWillis, seen by me on 01/24/18, split night sleep study on 02/05/18. Please call and notify patient that the recent sleep study confirmed the diagnosis of moderate to severe OSA. She did well with CPAP during the study with significant improvement of the respiratory events. Therefore, I would like start the patient on CPAP therapy at home by prescribing a machine for home use. I placed the order in the chart.  Please advise patient that we need a follow up appointment with either myself or one of our nurse practitioners in about 10 weeks post set-up to check for how the patient is feeling and how well the patient is using the machine, etc. Please go ahead and schedule the appointment, while you have the patient on the phone and make sure patient understands the importance of keeping this window for the FU appointment, as it is often an insurance requirement. Failing to adhere to this may result in losing coverage for sleep apnea treatment, at which point most patients are left with a choice of returning the machine or paying out of pocket (and we want neither of this to happen!).  Please re-enforce the importance of compliance with treatment and the need for us to monitor compliance data - again an insurance requirement and usually a good feedback for the patient as far as how they are doing.  Also remind patient, that any PAP machine or mask issues should be first addressed with the DME company, who provided the machine/mask.  Please ask if patient has a preference regarding DME company, may depend on the insurance too.  Please arrange for CPAP set up at home through a DME company of patient's choice.  Once you have spoken to the patient you can close the phone encounter. Please fax/route report to referring provider, thanks,   Huston FoleySaima Athar, MD, PhD [facility])

## 2018-02-13 NOTE — Telephone Encounter (Signed)
I called pt and explained her sleep study results. Pt has a cpap at home that is in storage, with equipment. Since she does not have current health insurance, she is asking to avoid using a DME. She is asking if our sleep lab manager can look at the machine and set it to the correct pressure. Pt says that she has plenty of supplies. Pt was agreeable to an appt on 05/22/18 at 9:30am with Dr. Frances FurbishAthar for follow up. I will send to Zella BallRobin for further discussion with the pt.

## 2018-02-14 ENCOUNTER — Telehealth: Payer: Self-pay

## 2018-02-14 NOTE — Telephone Encounter (Signed)
Spoke with patient to help with her cpap machine. She currently does not have insurance and is going to use her moms unit that is not in use. I went over the equipment over phone and how to set pressure. She is going to clean it up and try top set it but if she has trouble she will bring machine in tomorrow and I will help her. I explained that she needed an SDcard and she was going to look for that.

## 2018-04-20 ENCOUNTER — Ambulatory Visit: Payer: Self-pay | Admitting: Adult Health

## 2018-04-20 NOTE — Progress Notes (Addendum)
[facility]  PATIENT: Nancy Strickland DOB: May 24, 1971   REASON FOR VISIT: Follow-up for obstructive sleep apnea here for CPAP compliance headaches HISTORY FROM:headache    HISTORY OF PRESENT ILLNESS:UPDATE 5/24/2019CM Nancy Strickland, 47 year old female returns for follow-up with newly diagnosed obstructive sleep apnea.  She does not have insurance and she is using a CPAP machine that her mother had. Zella Ball told her how to program it over the phone. The chip cannot be read today by the computer system. Patient does say she feels better on CPAP,  headaches are improved .She is having side effects to Topamax and wants to taper off the medication.  She continues to smoke.  Her ESS is 8 out of 24.  She returns for reevaluation  Nancy Strickland, upon your kind request in my clinic today for initial consultation of her sleep disorder, in particular, concern for underlying obstructive sleep apnea. The patient is unaccompanied today. As you know, Nancy Strickland is a 47 year old right-handed woman with an underlying medical history of headache, depression, anemia, memory disturbance, smoking and morbid obesity with BMI of over 50, who reports snoring and excessive daytime somnolence. I reviewed your office note from 12/30/2017. Her Epworth sleepiness score is 8 out of 24, fatigue score is 55 out of 63. She lives with her husband and her son. She does not work. She smokes about a pack per day and drinks alcohol occasionally, drinks caffeine in the form of coffee, 2-3 cups per day, generally speaking.        She has been gaining weight. Mother had sleep apnea. She has not had a set bedtime or rise time. She is typically out of bed by 9. It may take her hours to fall asleep. She has nocturia about once per average night, she reports frequent morning headaches. She has been gaining weight. She has woken up with a sense of choking. She has been sleeping with her feet up and head of bed elevated for  the past 2 years or so. She bought a wedge for her bed.    REVIEW OF SYSTEMS: Full 14 system review of systems performed and notable only for those listed, all others are neg:  Constitutional: neg  Cardiovascular: neg Ear/Nose/Throat: neg  Skin: neg Eyes: neg Respiratory: neg Gastroitestinal: neg  Hematology/Lymphatic: neg  Endocrine: neg Musculoskeletal:neg Allergy/Immunology: neg Neurological: neg Psychiatric: neg Sleep : Obstructive sleep apnea with CPAP   ALLERGIES: Allergies  Allergen Reactions  . Codeine Nausea And Vomiting    Vomiting   . Hydrocodone Nausea And Vomiting    Violent vomititn  . Latex Other (See Comments)    Pulmonary-shortness of breath. If contact with skin gets a rash  . Oxycodone Nausea And Vomiting    Vomiting   . Tramadol     Upsets stomach. Can tolerate if needed.  . Vicodin [Hydrocodone-Acetaminophen] Nausea And Vomiting    Vomiting     HOME MEDICATIONS: Outpatient Medications Prior to Visit  Medication Sig Dispense Refill  . diphenhydrAMINE (BENADRYL) 25 MG tablet Take 25 mg by mouth daily.    . naproxen sodium (ANAPROX) 220 MG tablet Take 440 mg by mouth daily as needed (pain).     . topiramate (TOPAMAX) 50 MG tablet Take 3 tablets (150 mg total) by mouth at bedtime. (Patient taking differently: Take 100 mg by mouth at bedtime. ) 90 tablet 3   No facility-administered medications prior to visit.     PAST MEDICAL HISTORY: Past Medical History:  Diagnosis Date  .  Anemia    During pregnancy  . Depression   . Headache   . Medical history non-contributory     PAST SURGICAL HISTORY: Past Surgical History:  Procedure Laterality Date  . ABDOMINAL HYSTERECTOMY    . BIOPSY  12/27/2016   Procedure: BIOPSY;  Surgeon: Corbin Ade, MD;  [facility]: [facility];  Service: Endoscopy;;  gastric and esophageal  . CHOLECYSTECTOMY    . COLONOSCOPY    . ESOPHAGOGASTRODUODENOSCOPY (EGD) WITH PROPOFOL N/A 12/27/2016   Procedure:  ESOPHAGOGASTRODUODENOSCOPY (EGD) WITH PROPOFOL;  Surgeon: Corbin Ade, MD;  Location: [facility];  Service: Endoscopy;  Laterality: N/A;  945  . LAPAROSCOPY     several   . MALONEY DILATION N/A 12/27/2016   Procedure: Elease Hashimoto DILATION;  Surgeon: Corbin Ade, MD;  Location: [facility];  Service: Endoscopy;  Laterality: N/A;  . OOPHORECTOMY    . right breast lumpectomy     benign    FAMILY HISTORY: Family History  Problem Relation Age of Onset  . Colon cancer Neg Hx     SOCIAL HISTORY: Social History   Socioeconomic History  . Marital status: Married    Spouse name: Tammy Sours  . Number of children: 1  . Years of education: 2  . Highest education level: Not on file  Occupational History  . Not on file  Social Needs  . Financial resource strain: Not on file  . Food insecurity:    Worry: Not on file    Inability: Not on file  . Transportation needs:    Medical: Not on file    Non-medical: Not on file  Tobacco Use  . Smoking status: Current Every Day Smoker    Packs/day: 1.00    Years: 23.00    Pack years: 23.00  . Smokeless tobacco: Never Used  Substance and Sexual Activity  . Alcohol use: No  . Drug use: No  . Sexual activity: Not on file  Lifestyle  . Physical activity:    Days per week: Not on file    Minutes per session: Not on file  . Stress: Not on file  Relationships  . Social connections:    Talks on phone: Not on file    Gets together: Not on file    Attends religious service: Not on file    Active member of club or organization: Not on file    Attends meetings of clubs or organizations: Not on file    Relationship status: Not on file  . Intimate partner violence:    Fear of current or ex partner: Not on file    Emotionally abused: Not on file    Physically abused: Not on file    Forced sexual activity: Not on file  Other Topics Concern  . Not on file  Social History Narrative   Lives with husband   Caffeine use: 2-3 cups per day   Right  handed      PHYSICAL EXAM  Vitals:   04/21/18 0827  BP: 121/80  Pulse: 86  SpO2: 96%  Weight: (!) 300 lb 12.8 oz (136.4 kg)  Height:  (1.6 m)   Body mass index is 53.28 kg/m.  Generalized: Well developed, morbidly obese female in no acute distress  Head: normocephalic and atraumatic,. Oropharynx benign  Neck: Supple,  Musculoskeletal: No deformity   Neurological examination   Mentation: Alert oriented to time, place, history taking. Attention span and concentration appropriate. Recent and remote memory intact.  Follows all commands  speech and language fluent.   Cranial nerve II-XII: Pupils were equal round reactive to light extraocular movements were full, visual field were full on confrontational test. Facial sensation and strength were normal. hearing was intact to finger rubbing bilaterally. Uvula tongue midline. head turning and shoulder shrug were normal and symmetric.Tongue protrusion into cheek strength was normal. Motor: normal bulk and tone, full strength in the BUE, BLE,  Sensory: normal and symmetric to light touch, pinprick, and  Vibration, in the upper and lower extremities Coordination: finger-nose-finger,  no dysmetria Gait and Station: Rising up from seated position without assistance, normal stance,  moderate stride, good arm swing, smooth turning, able to perform tiptoe, and heel walking without difficulty. Tandem gait is steady  DIAGNOSTIC DATA (LABS, IMAGING, TESTING) - I reviewed patient records, labs, notes, testing and imaging myself where available.  Lab Results  Component Value Date   WBC 8.6 12/22/2016   HGB 16.3 (H) 12/22/2016   HCT 47.3 (H) 12/22/2016   MCV 92.6 12/22/2016   PLT 219 12/22/2016      Component Value Date/Time   NA 137 12/22/2016 0853   K 4.3 12/22/2016 0853   CL 102 12/22/2016 0853   CO2 28 12/22/2016 0853   GLUCOSE 108 (H) 12/22/2016 0853   BUN 18 12/22/2016 0853   CREATININE 0.75 12/22/2016 0853   CALCIUM 9.3  12/22/2016 0853   GFRNONAA >60 12/22/2016 0853   GFRAA >60 12/22/2016 0853    Lab Results  Component Value Date   VITAMINB12 520 12/30/2017     ASSESSMENT AND PLAN  47 y.o. year old female  has a past medical history of Anemia, Depression, Headache, and Medical history non-contributory. here to follow-up for her obstructive sleep apnea and headaches.  Unable to obtain CPAP compliance from chip.  Patient does not have DME company and is using her mother's CPAP machine.  She has no insurance.  She is having side effects to Topamax   PLAN: Decrease Topamax to  for 2 weeks at night then stop the medication Bring Chip back next Tuesday to sleep lab ask for Robin to download Given information on migraine triggers reviewed these with her F/U in 4 months Nilda Riggs, [facility], [facility], APRN  [facility] [address], [address] [place], [place] [address] [phone]  I reviewed the above note and documentation by the Nurse Practitioner and agree with the history, physical exam, assessment and plan as outlined above. Huston Foley, MD, PhD [facility])

## 2018-04-21 ENCOUNTER — Other Ambulatory Visit: Payer: Self-pay

## 2018-04-21 ENCOUNTER — Encounter: Payer: Self-pay | Admitting: Nurse Practitioner

## 2018-04-21 ENCOUNTER — Ambulatory Visit: Payer: Self-pay | Admitting: Nurse Practitioner

## 2018-04-21 DIAGNOSIS — R51 Headache: Secondary | ICD-10-CM

## 2018-04-21 DIAGNOSIS — R519 Headache, unspecified: Secondary | ICD-10-CM

## 2018-04-21 DIAGNOSIS — G4733 Obstructive sleep apnea (adult) (pediatric): Secondary | ICD-10-CM | POA: Insufficient documentation

## 2018-04-21 DIAGNOSIS — Z9989 Dependence on other enabling machines and devices: Secondary | ICD-10-CM

## 2018-04-21 NOTE — Patient Instructions (Signed)
Decrease Topamax to  for 2 weeks at night then stop the medication Bring Chip back next Tuesday to sleep lab ask for Robin to download Given information on migraine triggers F/U in 4 months

## 2018-04-22 IMAGING — US US THYROID
1 series · 14 of 25 positions shown · non-contrast
Comparison: None available

CLINICAL DATA: Thyromegaly on exam

EXAM:
THYROID ULTRASOUND
TECHNIQUE: Ultrasound examination of the thyroid gland and adjacent soft
tissues was performed.

[Series 1: us thyroid · 0.07mm/px · 14 of 40 slices shown]
[im 1/40]
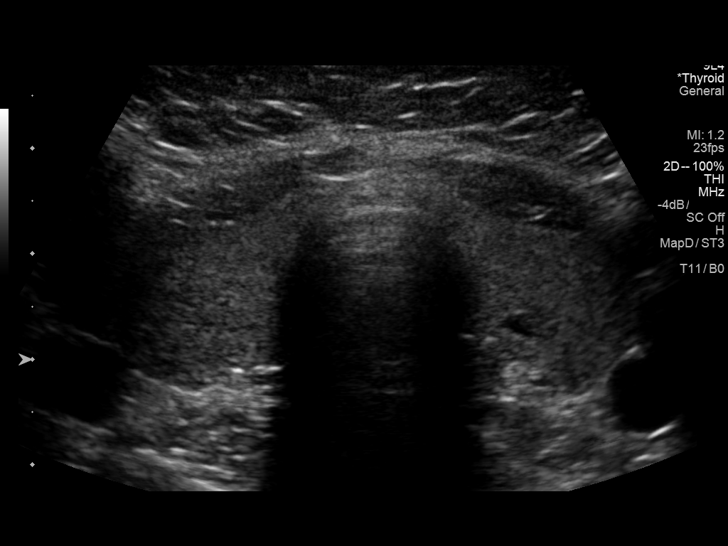
[im 4/40]
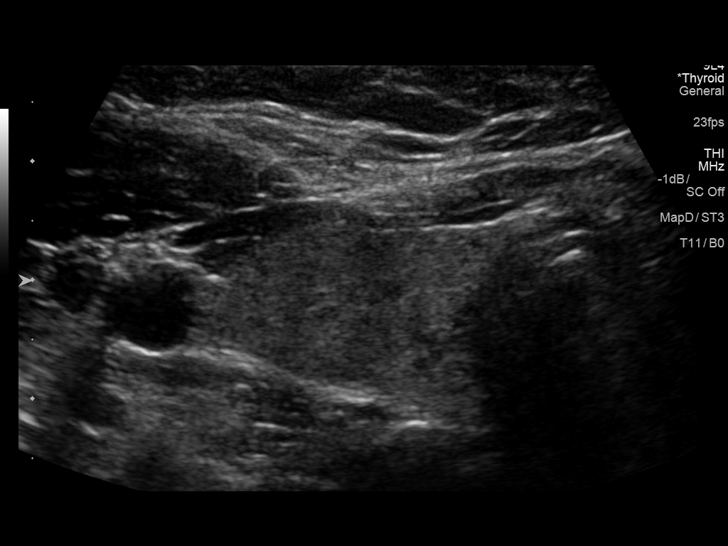
[im 7/40]
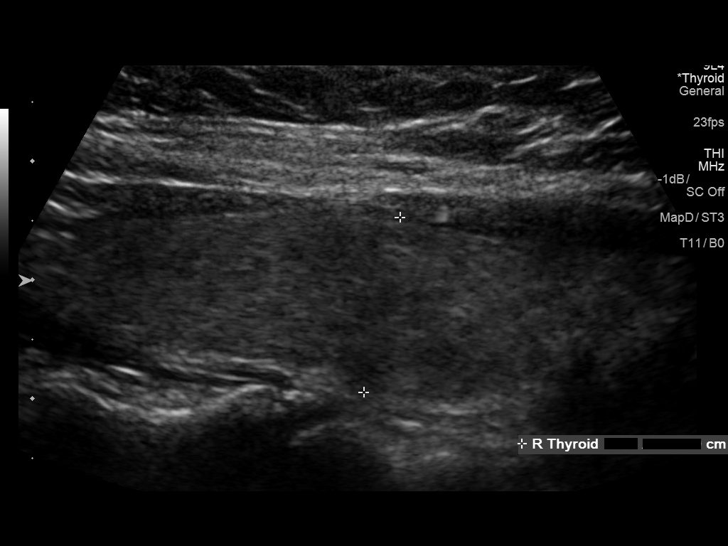
[im 10/40]
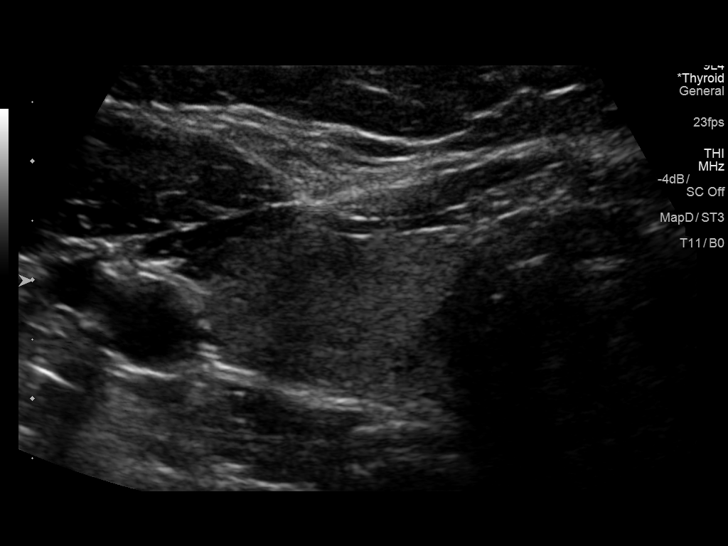
[im 14/40]
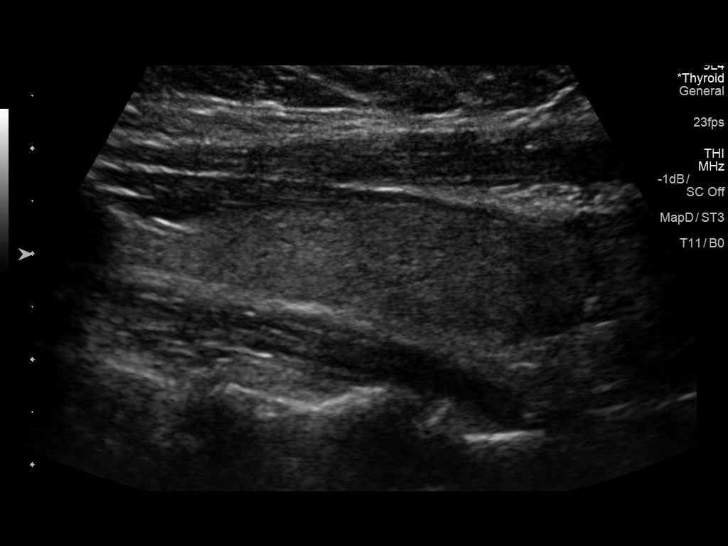
[im 15/40]
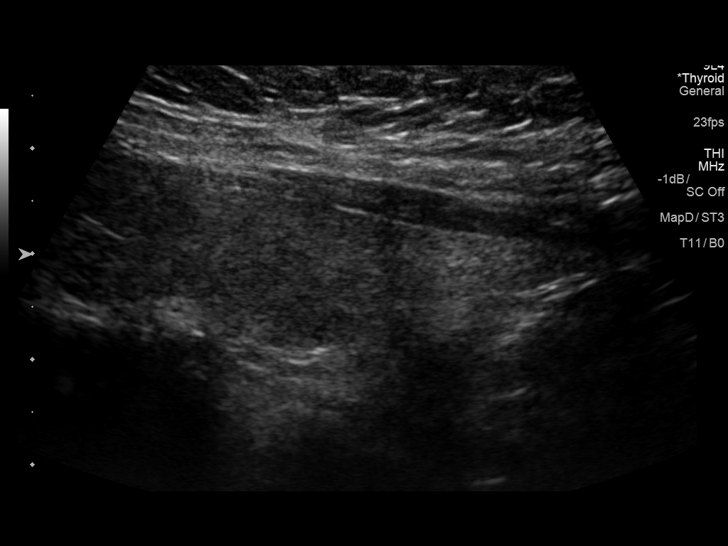
[im 18/40]
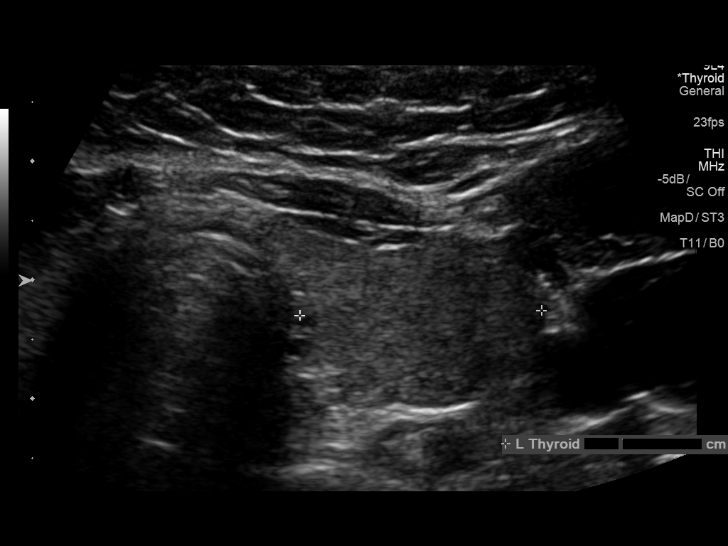
[im 22/40]
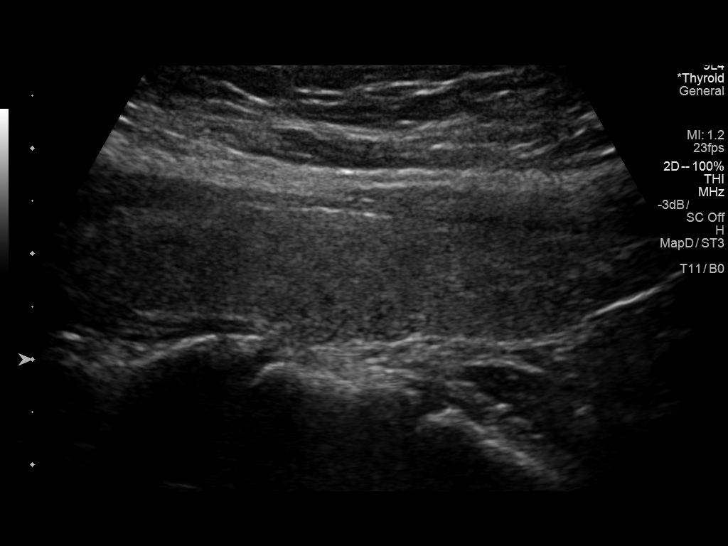
[im 25/40]
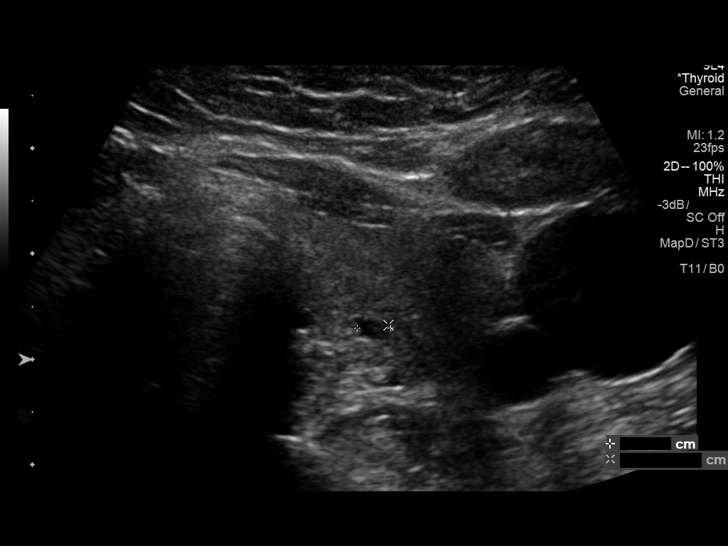
[im 27/40]
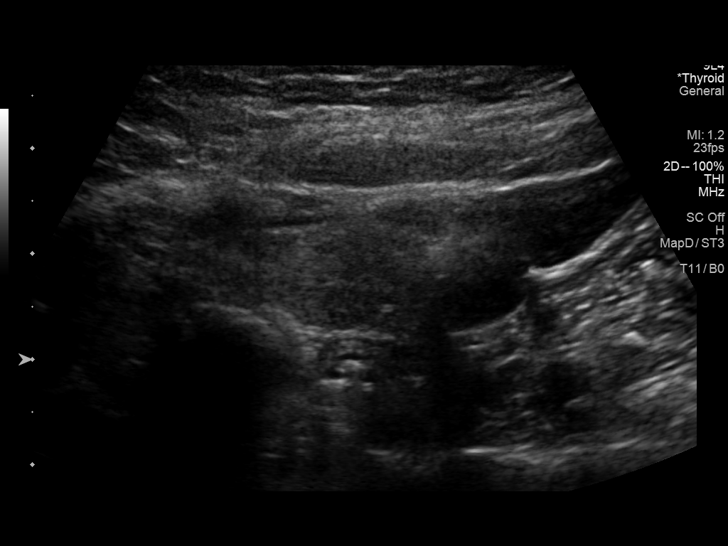
[im 30/40]
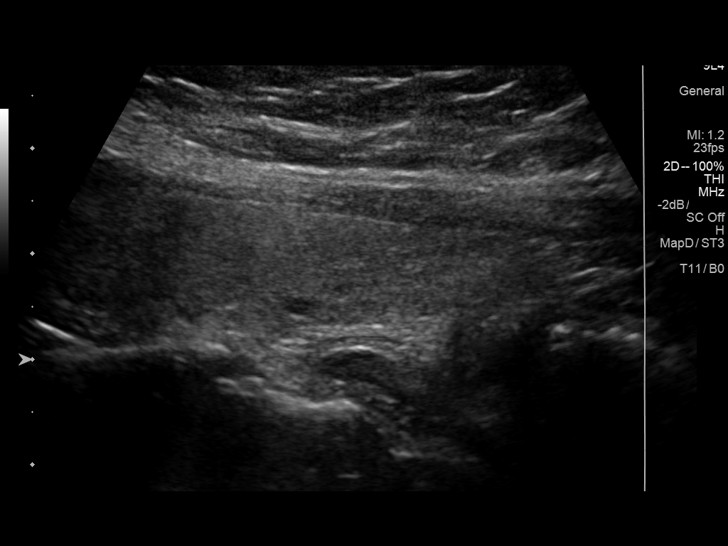
[im 33/40]
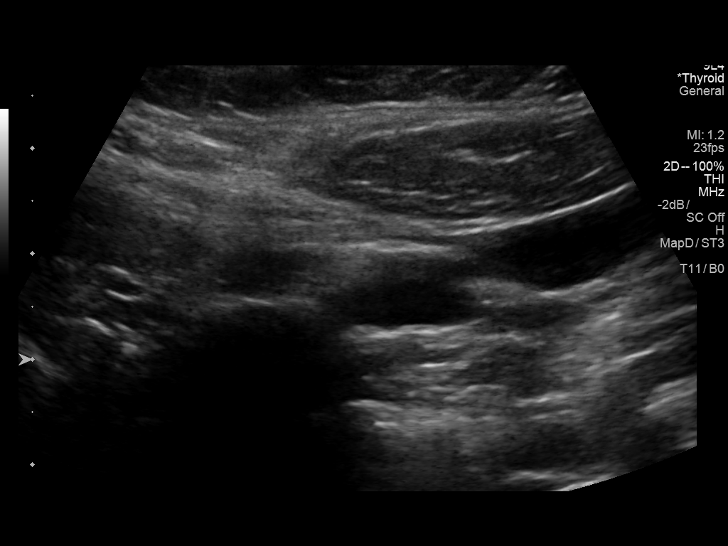
[im 36/40]
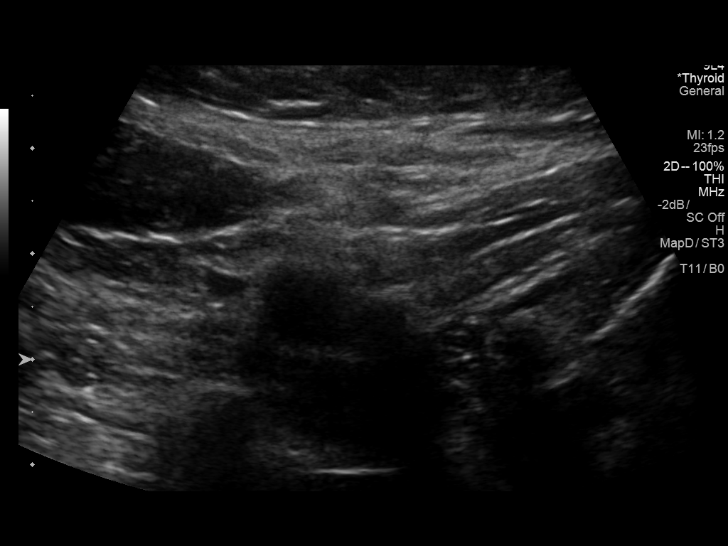
[im 40/40]
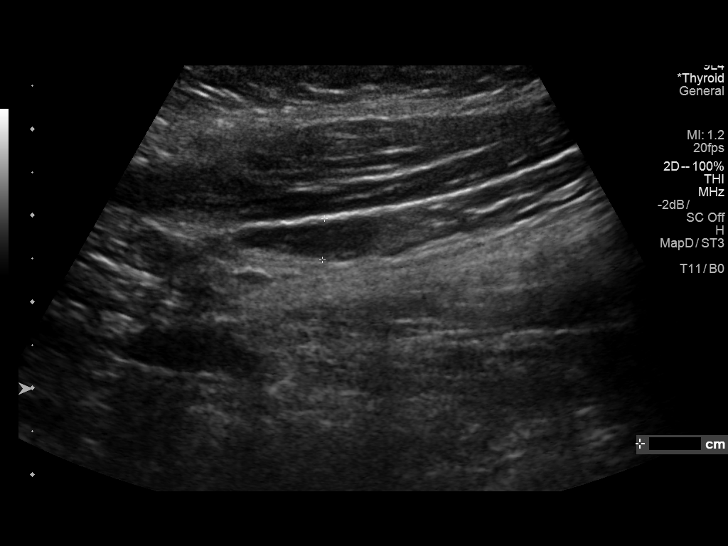

[14 of 25 positions shown; findings below may reference images not displayed]

FINDINGS: Parenchymal Echotexture: Mildly heterogenous

Isthmus: 0.4 cm

Right lobe: 5.8 x 1.6 x 2.2 cm

Left lobe: 5.5 x 1.3 x 2.0 70

_________________________________________________________

Estimated total number of nodules >/= 1 cm: 0

Number of spongiform nodules >/=  2 cm not described below (TR1): 0

Number of mixed cystic and solid nodules >/= 1.5 cm not described
below (TR2): 0

Tiny incidental anechoic left thyroid subcentimeter cyst only
measures 3 mm in the midpole. This does not meet criteria for
follow-up or biopsy.

No other significant thyroid abnormality.  No adenopathy.
IMPRESSION: Incidental 3 mm left midpole thyroid cyst. No other significant
thyroid abnormality.

The above is in keeping with the ACR TI-RADS recommendations - [HOSPITAL] 1647;[DATE].

## 2018-04-25 ENCOUNTER — Telehealth: Payer: Self-pay | Admitting: Nurse Practitioner

## 2018-04-25 NOTE — Telephone Encounter (Signed)
Called pt and LVM.  Appt was scheduled in error, patient needs to see [facility] for CPAP per [place], np.

## 2018-04-26 ENCOUNTER — Ambulatory Visit: Payer: Self-pay | Admitting: Nurse Practitioner

## 2018-05-08 ENCOUNTER — Encounter: Payer: Self-pay | Admitting: Neurology

## 2018-05-09 NOTE — Telephone Encounter (Signed)
Pt rec'd a call to r/s the appt with Dr Frances FurbishAthar on 6/24-provider will be out of the office. This appt does not need to be r[facility]s [facility]. She needs to see Zella BallRobin and then a 4mth appt with Eber Jonesarolyn.  Pt was transferred to Det[place] NorthRobin  FYI

## 2018-05-10 ENCOUNTER — Telehealth: Payer: Self-pay

## 2018-05-10 NOTE — Telephone Encounter (Signed)
noted 

## 2018-05-10 NOTE — Telephone Encounter (Signed)
Patient came to sleep lab to get help with download from her cpap machine. I was able to get download. It is under her mothers name. Her mom does not use machine and she got this instead of buying one. I will change the name on report to her name. She is compliant ans apnea is treated. She will be scheduled for a follow-up appointment with Sarita Bottomarolyn Martin,NP in 4-646months.

## 2018-05-22 ENCOUNTER — Ambulatory Visit: Payer: Self-pay | Admitting: Neurology

## 2018-09-22 ENCOUNTER — Institutional Professional Consult (permissible substitution): Payer: Self-pay | Admitting: Plastic Surgery

## 2018-10-02 ENCOUNTER — Ambulatory Visit: Payer: Self-pay | Admitting: Nurse Practitioner

## 2018-10-03 IMAGING — US US THYROID
1 series · 13 of 25 positions shown · non-contrast
Comparison: Thyroid ultrasound - 01/11/2017

CLINICAL DATA: Goiter.  Follow-up thyroid cyst.

EXAM:
THYROID ULTRASOUND
TECHNIQUE: Ultrasound examination of the thyroid gland and adjacent soft
tissues was performed.

[Series 1: us thyroid · 0.08mm/px · 13 of 43 slices shown]
[im 1/43]
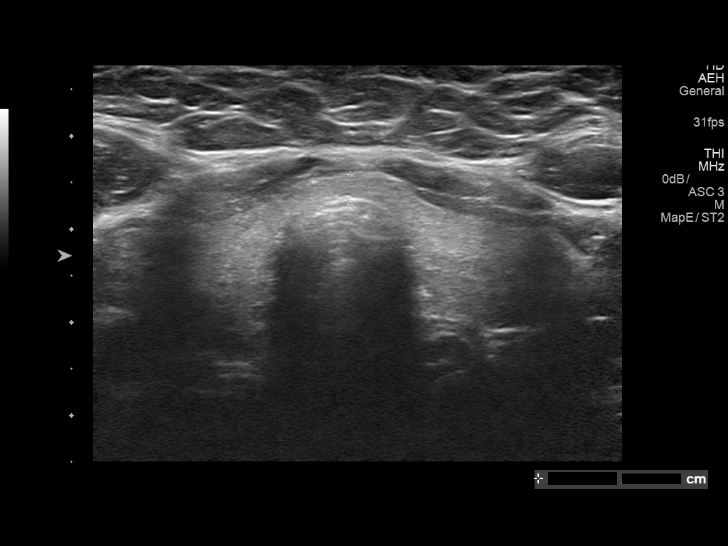
[im 4/43]
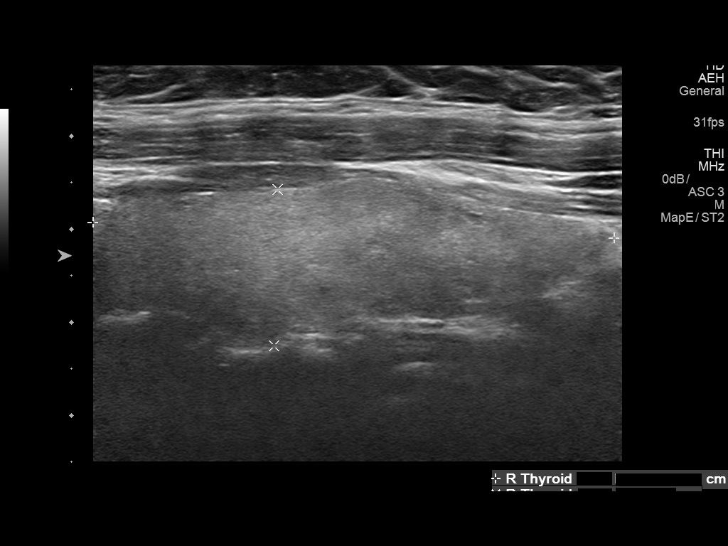
[im 8/43]
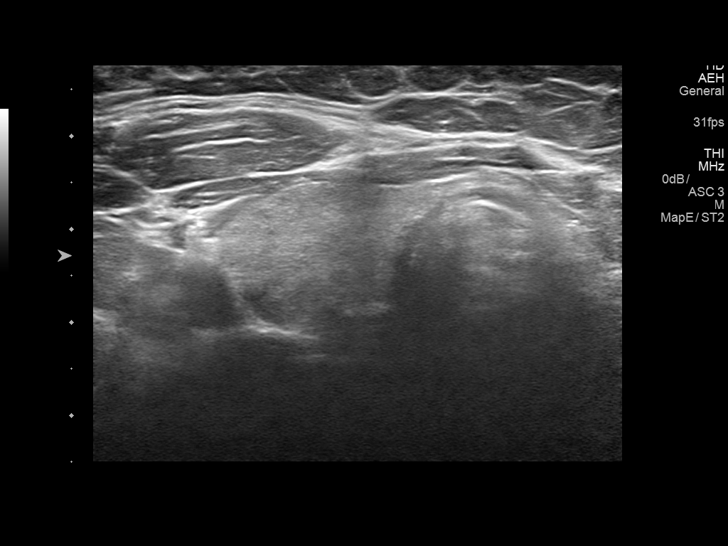
[im 11/43]
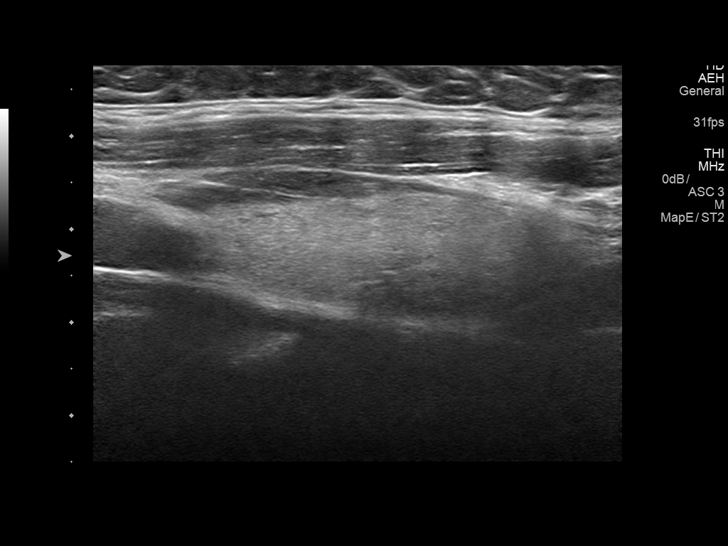
[im 15/43]
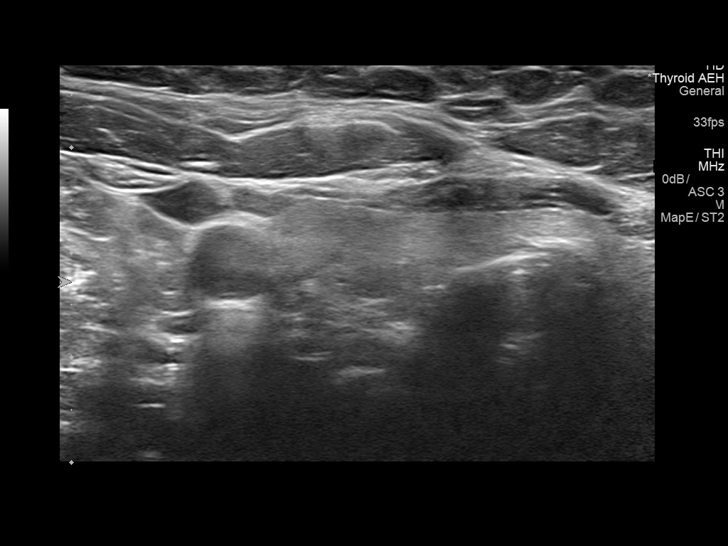
[im 18/43]
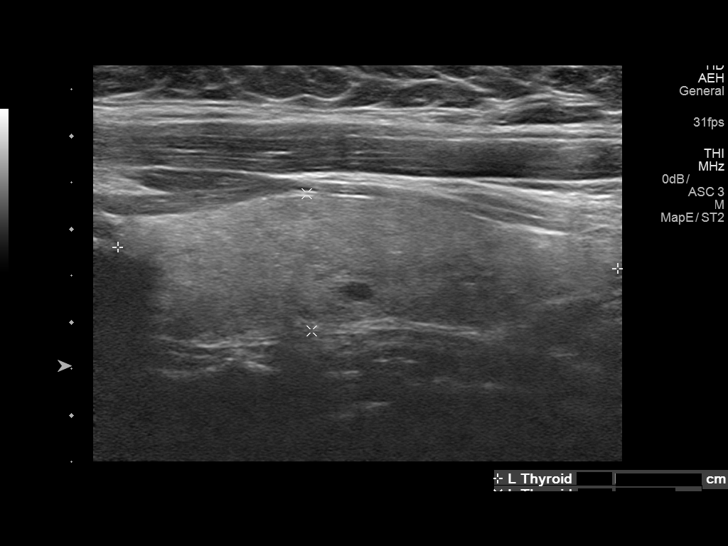
[im 22/43]
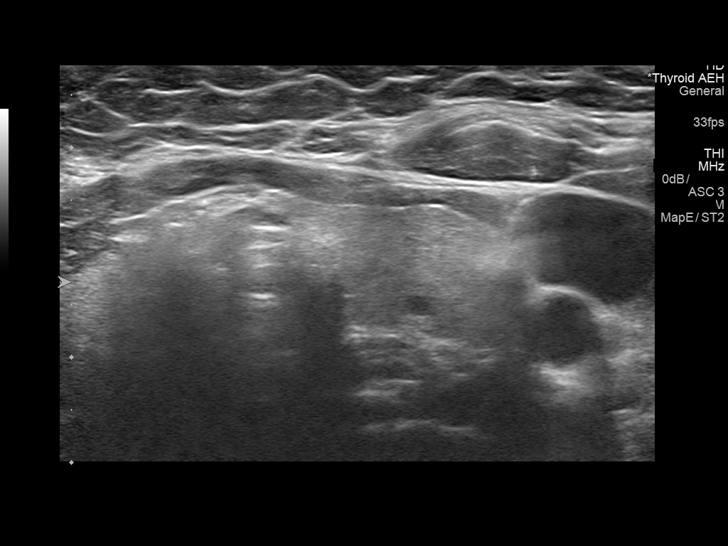
[im 25/43]
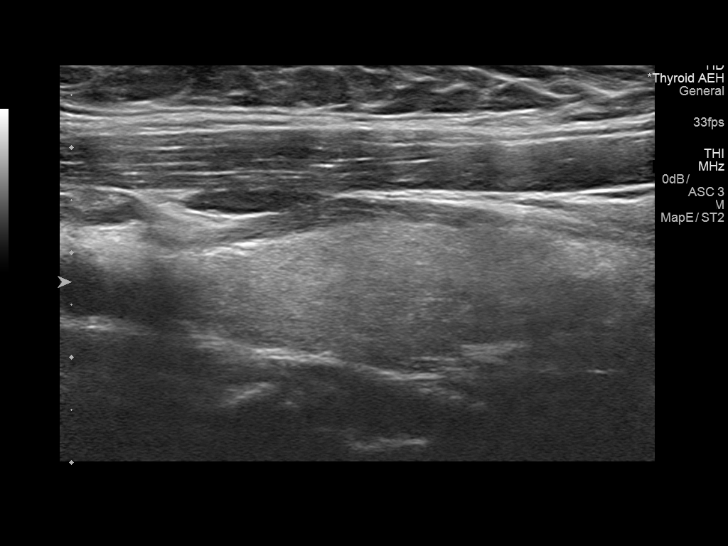
[im 29/43]
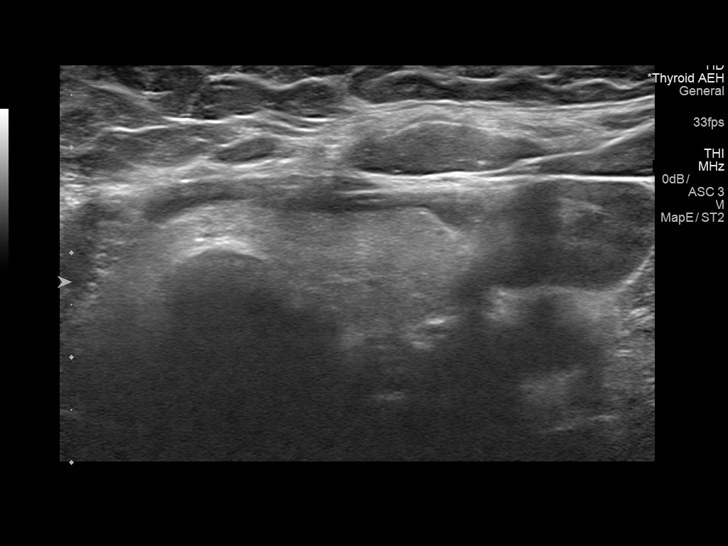
[im 32/43]
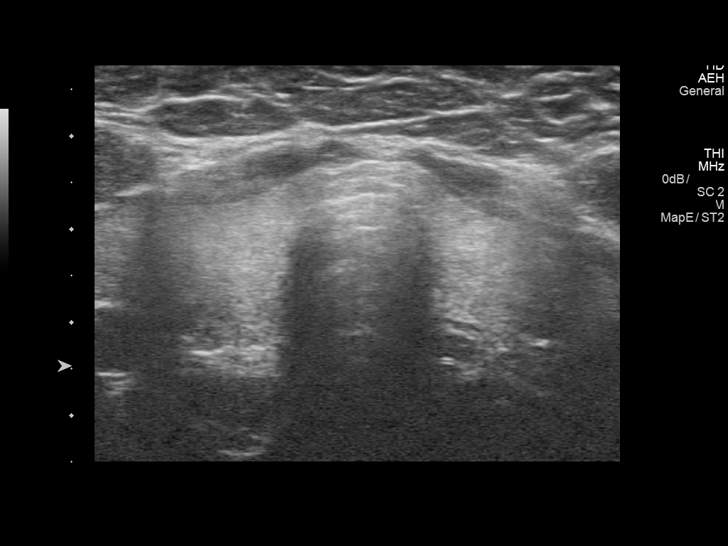
[im 36/43]
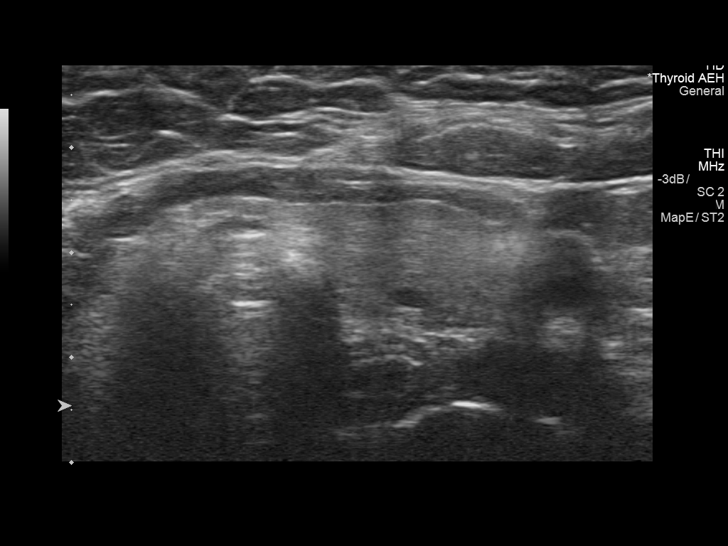
[im 39/43]
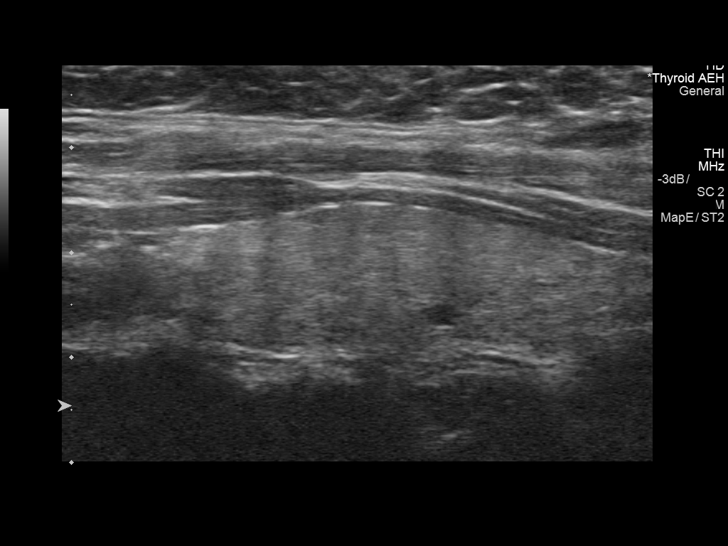
[im 43/43]
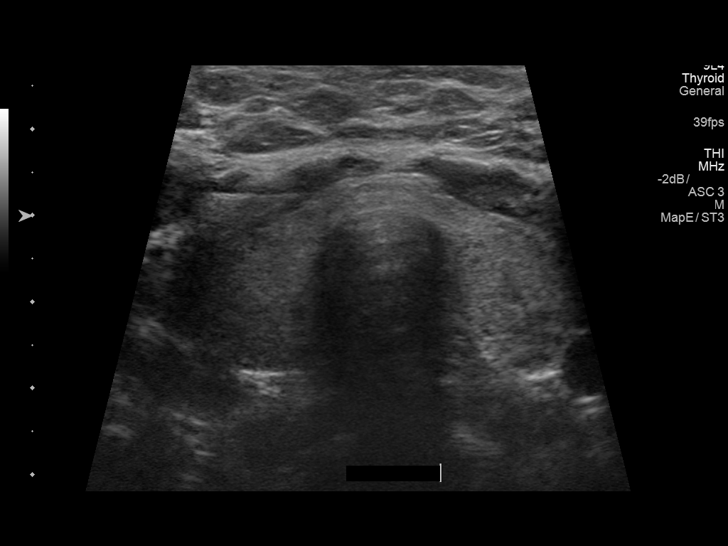

[13 of 25 positions shown; findings below may reference images not displayed]

FINDINGS: Parenchymal Echotexture: Mildly heterogenous

Isthmus: Normal in size measures 0.3 cm in diameter, unchanged

Right lobe: Normal in size measuring 5.6 x 1.7 x 2.0 cm, unchanged,
previously, 5.8 x 1.6 x 2.2 cm

Left lobe: Normal in size measuring 5.4 x 1.5 x 1.8 cm, unchanged,
previously, 5.5 x 1.3 x 2.0 cm

_________________________________________________________

Estimated total number of nodules >/= 1 cm: 0

Number of spongiform nodules >/=  2 cm not described below (TR1): 0

Number of mixed cystic and solid nodules >/= 1.5 cm not described
below (TR2): 0

_________________________________________________________

There is an unchanged punctate (approximately 0.4 cm) anechoic cyst
within the mid/ posterior aspect the left lobe of the thyroid (image
19) which is not meet imaging criteria to recommend percutaneous
sampling or dedicated follow-up

There is an additional punctate (approximately 0.3 cm) anechoic cyst
within the superior pole of the left lobe of the thyroid which was
not definitely imaged on the previous examination though also does
not meet imaging criteria to recommend percutaneous sampling or
dedicated follow-up.
IMPRESSION: 1. Left-sided punctate (sub 4 mm) anechoic cysts, neither of which
meet imaging criteria to recommend percutaneous sampling or
dedicated follow-up.
2. Otherwise, normal thyroid ultrasound.

The above is in keeping with the ACR TI-RADS recommendations - [HOSPITAL] 3200;[DATE].

## 2018-10-06 ENCOUNTER — Encounter: Payer: Self-pay | Admitting: Plastic Surgery

## 2018-10-06 ENCOUNTER — Ambulatory Visit (INDEPENDENT_AMBULATORY_CARE_PROVIDER_SITE_OTHER): Payer: Self-pay | Admitting: Plastic Surgery

## 2018-10-06 VITALS — BP 132/88 | HR 98 | Ht 63.0 in | Wt 300.0 lb

## 2018-10-06 DIAGNOSIS — G8929 Other chronic pain: Secondary | ICD-10-CM | POA: Insufficient documentation

## 2018-10-06 DIAGNOSIS — M542 Cervicalgia: Secondary | ICD-10-CM | POA: Insufficient documentation

## 2018-10-06 DIAGNOSIS — N62 Hypertrophy of breast: Secondary | ICD-10-CM | POA: Insufficient documentation

## 2018-10-06 DIAGNOSIS — M546 Pain in thoracic spine: Secondary | ICD-10-CM

## 2018-10-06 NOTE — Progress Notes (Signed)
Patient ID: Nancy Strickland, female    DOB: 1971-09-24, 47 y.o.   MRN: [id number]   Chief Complaint  Patient presents with  . Breast Problem    Mammary Hyperplasia: The patient is a 47 y.o. female with a history of mammary hyperplasia for several years.  She has extremely large breasts causing symptoms that include the following: Back pain (upper and lower) and neck pain. She frequently pins bra cups higher on straps for better lift and relief. Notices relief when holding breast up in her hands. Shoulder straps causing grooves, pain occasionally requiring padding. Pain medication is sometimes required with motrin and tylenol.  Activities that are hindered by enlarged breasts include: exercise and running as well as [place].  Her breasts are extremely large and fairly symmetric.  She has hyperpigmentation of the inframammary area on both sides.  The sternal to nipple distance on the right is 39 cm and the left is 38 cm.  The IMF distance is 19 cm.  She is 5 feet 3 inches tall and weighs 300 pounds.  Preoperative bra size = 44 G cup.  The estimated excess breast tissue to be removed at the time of surgery = 800 grams on the left and 800 grams on the right.  Mammogram history: Last mammogram was at least 8 years ago.  She had a abnormal mammogram 12 years ago and had a breast lesion excised.  She was told she has dense breast but there was no concern for cancer at this time.   Review of Systems  Constitutional: Positive for activity change. Negative for appetite change.  HENT: Negative.   Eyes: Negative.   Respiratory: Negative.  Negative for shortness of breath.   Cardiovascular: Negative.   Endocrine: Negative.   Genitourinary: Negative.   Musculoskeletal: Positive for back pain and neck pain.  Skin: Negative for color change.  Neurological: Negative.   Hematological: Negative.   Psychiatric/Behavioral: Negative.     Past Medical History:  Diagnosis Date    . Anemia    During pregnancy  . Depression   . Headache   . Medical history non-contributory     Past Surgical History:  Procedure Laterality Date  . ABDOMINAL HYSTERECTOMY    . BIOPSY  12/27/2016   Procedure: BIOPSY;  Surgeon: Corbin Ade, MD;  Location: [facility];  Service: Endoscopy;;  gastric and esophageal  . CHOLECYSTECTOMY    . COLONOSCOPY    . ESOPHAGOGASTRODUODENOSCOPY (EGD) WITH PROPOFOL N/A 12/27/2016   Procedure: ESOPHAGOGASTRODUODENOSCOPY (EGD) WITH PROPOFOL;  Surgeon: Corbin Ade, MD;  Location: [facility];  Service: Endoscopy;  Laterality: N/A;  945  . LAPAROSCOPY     several   . MALONEY DILATION N/A 12/27/2016   Procedure: Elease Hashimoto DILATION;  Surgeon: Corbin Ade, MD;  Location: [facility];  Service: Endoscopy;  Laterality: N/A;  . OOPHORECTOMY    . right breast lumpectomy     benign      Current Outpatient Medications:  .  diphenhydrAMINE (BENADRYL) 25 MG tablet, Take 25 mg by mouth daily., Disp: , Rfl:  .  naproxen sodium (ANAPROX) 220 MG tablet, Take 440 mg by mouth daily as needed (pain). , Disp: , Rfl:  .  topiramate (TOPAMAX) 50 MG tablet, Take 3 tablets (150 mg total) by mouth at bedtime. (Patient taking differently: Take 100 mg by mouth at bedtime. ), Disp: 90 tablet, Rfl: 3   Objective:   Vitals:  10/06/18 1111  BP: 132/88  Pulse: 98  SpO2: 99%    Physical Exam  Constitutional: She is oriented to person, place, and time. She appears well-developed and well-nourished.  HENT:  Head: Normocephalic and atraumatic.  Eyes: Pupils are equal, round, and reactive to light. EOM are normal.  Cardiovascular: Normal rate.  Pulmonary/Chest: Effort normal. No respiratory distress.  Abdominal: Soft. She exhibits no distension. There is no tenderness.  Musculoskeletal: Normal range of motion.  Neurological: She is alert and oriented to person, place, and time.  Skin: Skin is warm. No rash noted. No erythema.  Psychiatric: She has a normal  mood and affect. Her behavior is normal. Judgment normal.    Assessment & Plan:  Symptomatic mammary hypertrophy  Chronic bilateral thoracic back pain  Neck pain   Recommend the patient stop smoking.  We discussed in length the risk of nicotine in the system with an elective surgery.  She needs to be 3 months with no nicotine prior to elective surgery.  She also needs an updated mammogram.  I have recommended that she get the mammogram and stop smoking.  Once she has been smoke-free for 2 months if she will give me a call and come back and we can submit for surgery and give her a quote for the cost.  Alena Bills Aceson Labell, DO

## 2022-12-06 ENCOUNTER — Ambulatory Visit: Payer: Self-pay | Admitting: Internal Medicine
